# Patient Record
Sex: Male | Born: 1975 | Race: Black or African American | Hispanic: No | Marital: Married | Smoking: Current every day smoker
Health system: Southern US, Community
[De-identification: ages and names within clinical notes are randomized; demographics above are authoritative.]

## PROBLEM LIST (undated history)

## (undated) DIAGNOSIS — F419 Anxiety disorder, unspecified: Secondary | ICD-10-CM

---

## 2010-09-26 ENCOUNTER — Emergency Department: Payer: Self-pay | Admitting: Unknown Physician Specialty

## 2012-08-30 ENCOUNTER — Emergency Department: Payer: Self-pay | Admitting: Emergency Medicine

## 2014-05-16 ENCOUNTER — Encounter (HOSPITAL_COMMUNITY): Payer: Self-pay | Admitting: Emergency Medicine

## 2014-05-16 ENCOUNTER — Emergency Department (HOSPITAL_COMMUNITY)
Admission: EM | Admit: 2014-05-16 | Discharge: 2014-05-16 | Disposition: A | Discharge status: Home / Self Care | Attending: Emergency Medicine | Admitting: Emergency Medicine

## 2014-05-16 DIAGNOSIS — H5712 Ocular pain, left eye: Secondary | ICD-10-CM | POA: Diagnosis present

## 2014-05-16 DIAGNOSIS — H109 Unspecified conjunctivitis: Secondary | ICD-10-CM | POA: Diagnosis not present

## 2014-05-16 MED ORDER — TETRACAINE HCL 0.5 % OP SOLN
OPHTHALMIC | Status: AC
  Filled 2014-05-16: qty 2

## 2014-05-16 MED ORDER — GENTAMICIN SULFATE 0.3 % OP SOLN: 2.0000 [drp] | Freq: Four times a day (QID) | OPHTHALMIC | Status: DC

## 2014-05-16 MED ORDER — FLUORESCEIN SODIUM 1 MG OP STRP
ORAL_STRIP | OPHTHALMIC | Status: AC
  Filled 2014-05-16: qty 1

## 2014-05-16 NOTE — ED Provider Notes (Addendum)
CSN: [id number]     Arrival date & time 05/16/14  [id number] History   First MD Initiated Contact with Patient 05/16/14 0539     Chief Complaint  Patient presents with  . Eye Pain     (Consider location/radiation/quality/duration/timing/severity/associated sxs/prior Treatment) HPI Comments: Patient is a 39 year old male with no significant past medical history. He complaints of left eye redness and discomfort for the past week. He is currently incarcerated and was given antibiotics by the medical staff at the jail. He was treated with Keflex however is not improving. He is brought here to be evaluated for this now.  Patient is a 39 y.o. male presenting with eye pain. The history is provided by the patient.  Eye Pain This is a new problem. Episode onset: 5 days ago. The problem occurs constantly. The problem has been gradually worsening. Nothing aggravates the symptoms. Nothing relieves the symptoms. He has tried nothing for the symptoms.    History reviewed. No pertinent past medical history. History reviewed. No pertinent past surgical history. History reviewed. No pertinent family history. History  Substance Use Topics  . Smoking status: Never Smoker   . Smokeless tobacco: Not on file  . Alcohol Use: No    Review of Systems  Eyes: Positive for pain.  All other systems reviewed and are negative.     Allergies  Review of patient's allergies indicates not on file.  Home Medications   Prior to Admission medications   Not on File   BP 135/104 mmHg  Pulse 64  Temp(Src) 97.6 F (36.4 C) (Oral)  Resp 20  Ht 6\' 1"  (1.854 m)  Wt 228 lb (103.42 kg)  BMI 30.09 kg/m2  SpO2 100% Physical Exam  Constitutional: He appears well-developed and well-nourished. No distress.  HENT:  Head: Normocephalic and atraumatic.  Eyes:  The left eye is noted to have conjunctiva that is injected with clear drainage. The cornea is clear to both gross inspection and fluorescein staining. Anterior  chamber is clear. Intraocular pressure was measured with the Tono-Pen and was found to be 4, 6, and 7.  Skin: He is not diaphoretic.  Nursing note and vitals reviewed.   ED Course  Procedures (including critical care time) Labs Review Labs Reviewed - No data to display  Imaging Review No results found.   EKG Interpretation None      MDM   Final diagnoses:  None    This appears to be a conjunctivitis. There is no concern for glaucoma or corneal involvement. The patient continues to tell me that the last time he had this he was given 2 drops. He tells me one was for infection and the other was for "pressure". His eye pressures were checked with the Tono-Pen and were found to be normal. I will treat with gentamicin drops and when necessary follow-up. Patient seems somewhat annoyed and I'm not giving him another drop, however I do not feel as though another is indicated.    Geoffery Lyonsouglas Montine Hight, MD 05/16/14 [id number]  Geoffery Lyonsouglas Marianita Botkin, MD 05/16/14 [phone]

## 2014-05-16 NOTE — Discharge Instructions (Signed)
Gentamicin drops as prescribed.  Ibuprofen 600 mg every 6 hours as needed for pain.  Return to the emergency department if your symptoms substantially worsen or change.   Conjunctivitis Conjunctivitis is commonly called "pink eye." Conjunctivitis can be caused by bacterial or viral infection, allergies, or injuries. There is usually redness of the lining of the eye, itching, discomfort, and sometimes discharge. There may be deposits of matter along the eyelids. A viral infection usually causes a watery discharge, while a bacterial infection causes a yellowish, thick discharge. Pink eye is very contagious and spreads by direct contact. You may be given antibiotic eyedrops as part of your treatment. Before using your eye medicine, remove all drainage from the eye by washing gently with warm water and cotton balls. Continue to use the medication until you have awakened 2 mornings in a row without discharge from the eye. Do not rub your eye. This increases the irritation and helps spread infection. Use separate towels from other household members. Wash your hands with soap and water before and after touching your eyes. Use cold compresses to reduce pain and sunglasses to relieve irritation from light. Do not wear contact lenses or wear eye makeup until the infection is gone. SEEK MEDICAL CARE IF:   Your symptoms are not better after 3 days of treatment.  You have increased pain or trouble seeing.  The outer eyelids become very red or swollen. Document Released: 03/31/2004 Document Revised: 05/16/2011 Document Reviewed: 02/21/2005 [facility] Patient Information 2015 [place], [place]. This information is not intended to replace advice given to you by your health care provider. Make sure you discuss any questions you have with your health care provider.

## 2014-05-16 NOTE — ED Notes (Signed)
Pt c/o left eye pain and pressure x one week. Pt denies any vision changes.

## 2014-05-27 ENCOUNTER — Encounter (HOSPITAL_COMMUNITY): Payer: Self-pay | Admitting: Emergency Medicine

## 2014-05-27 ENCOUNTER — Emergency Department (HOSPITAL_COMMUNITY)
Admission: EM | Admit: 2014-05-27 | Discharge: 2014-05-27 | Disposition: A | Discharge status: Home / Self Care | Attending: Emergency Medicine | Admitting: Emergency Medicine

## 2014-05-27 DIAGNOSIS — Z792 Long term (current) use of antibiotics: Secondary | ICD-10-CM | POA: Diagnosis not present

## 2014-05-27 DIAGNOSIS — H5712 Ocular pain, left eye: Secondary | ICD-10-CM | POA: Diagnosis present

## 2014-05-27 MED ORDER — TOBRAMYCIN 0.3 % OP SOLN: 2.0000 [drp] | OPHTHALMIC | Status: DC

## 2014-05-27 MED ORDER — FLUORESCEIN SODIUM 1 MG OP STRP
ORAL_STRIP | OPHTHALMIC | Status: AC
  Administered 2014-05-27: 07:00:00
  Filled 2014-05-27: qty 1

## 2014-05-27 MED ORDER — TETRACAINE HCL 0.5 % OP SOLN
2.0000 [drp] | Freq: Once | OPHTHALMIC | Status: AC
  Administered 2014-05-27: 2 [drp] via OPHTHALMIC
  Filled 2014-05-27: qty 2

## 2014-05-27 NOTE — ED Notes (Signed)
Pt c/o left eye pain since 05/16/14, pt states he was seen for the same.

## 2014-05-27 NOTE — ED Provider Notes (Signed)
CSN: [id number]     Arrival date & time 05/27/14  0357 History   First MD Initiated Contact with Patient 05/27/14 [phone]     Chief Complaint  Patient presents with  . Eye Pain     (Consider location/radiation/quality/duration/timing/severity/associated sxs/prior Treatment) HPI  Patient reports he was having problems with his left eye was seen in the ER on March 11 and was given antibiotic drops which had helped his pain. He states his eye was no longer read in 2 days. He was seen by the prison physician and told him his eyes were dry and he stopped the antibiotic drops and put him on saline eyedrops. Relates March 20 he started getting watering drainage from his eyes and feels like his vision is blurred in the left eye. He states his left eye hurts when he first opens it and he sees the light. He denies any itching. He denies any purulent drainage. She had some other eye problem in 2013 with his eye and saw her ophthalmologist. He does not know what that diagnosis was.    History reviewed. No pertinent past medical history. History reviewed. No pertinent past surgical history. History reviewed. No pertinent family history. History  Substance Use Topics  . Smoking status: Never Smoker   . Smokeless tobacco: Not on file  . Alcohol Use: No  pt currently incarcerated  Review of Systems  All other systems reviewed and are negative.     Allergies  Review of patient's allergies indicates not on file.  Home Medications   Prior to Admission medications   Medication Sig Start Date End Date Taking? Authorizing Provider  gentamicin (GARAMYCIN) 0.3 % ophthalmic solution Place 2 drops into both eyes 4 (four) times daily. 05/16/14   Geoffery Lyonsouglas Delo, MD  tobramycin (TOBREX) 0.3 % ophthalmic solution Place 2 drops into the left eye every 2 (two) hours while awake. 05/27/14   Devoria AlbeIva Althea Backs, MD   BP 144/97 mmHg  Pulse 66  Temp(Src) 97.9 F (36.6 C)  Resp 17  Ht 6\' 1"  (1.854 m)  Wt 228 lb (103.42 kg)   BMI 30.09 kg/m2  SpO2 100%  Vital signs normal   Physical Exam  Constitutional: He is oriented to person, place, and time. He appears well-developed and well-nourished.  Non-toxic appearance. He does not appear ill. No distress.  HENT:  Head: Normocephalic and atraumatic.  Right Ear: External ear normal.  Left Ear: External ear normal.  Nose: Nose normal. No mucosal edema or rhinorrhea.  Mouth/Throat: Oropharynx is clear and moist and mucous membranes are normal. No dental abscesses or uvula swelling.  Eyes: Conjunctivae and EOM are normal. Pupils are equal, round, and reactive to light.  Visual Acuity - Bilateral Distance: 20/25 ; R Distance: 20/25 ; L Distance: 20/30 Patient has bilateral miotic pupils. He has mild diffuse injection of both eyes worse on the left.  Neck: Normal range of motion and full passive range of motion without pain. Neck supple.  Pulmonary/Chest: Effort normal. No respiratory distress. He has no rhonchi. He exhibits no crepitus.  Abdominal: Soft. Normal appearance and bowel sounds are normal.  Musculoskeletal: Normal range of motion. He exhibits no edema or tenderness.  Moves all extremities well.   Neurological: He is alert and oriented to person, place, and time. He has normal strength. No cranial nerve deficit.  Skin: Skin is warm, dry and intact. No rash noted. No erythema. No pallor.  Psychiatric: He has a normal mood and affect. His speech is normal and  behavior is normal. His mood appears not anxious.  Nursing note and vitals reviewed.      ED Course  Procedures (including critical care time)  Medications  tetracaine (PONTOCAINE) 0.5 % ophthalmic solution 2 drop (not administered)  fluorescein 1 MG ophthalmic strip (not administered)   Fluorescein stain uptake was negative in the left eye.  Intraocular pressure was measured at 20 which is normal in the left eye.   Labs Review Labs Reviewed - No data to display  Imaging Review No results  found.   EKG Interpretation None      MDM  patient is noted to have some mild light sensitivity and drainage from his left eye. He may have a conjunctivitis but he most likely is developing an iritis. He was started on antibiotic eyedrops. He was given the ophthalmology referral for further evaluation.    Final diagnoses:  Eye pain, left    New Prescriptions   TOBRAMYCIN (TOBREX) 0.3 % OPHTHALMIC SOLUTION    Place 2 drops into the left eye every 2 (two) hours while awake.    Plan discharge  Devoria Albe, MD, Concha Pyo, MD 05/27/14 [phone]

## 2014-05-27 NOTE — ED Notes (Signed)
pt states eye drops he was given he was able to use for 2 days & Dr at the facility stopped the drops. Pt says eye was getting better before drops were stopped.

## 2014-05-27 NOTE — Discharge Instructions (Signed)
Use the eye drops in the affected eye every 2 hrs while awake the first 24 hrs then 4 times a day. If your eye isn't improving consider seeing an ophthalmologist. You may be developing iritis and may need dilating eye drops if you develop more light sensitivity.

## 2019-02-13 ENCOUNTER — Other Ambulatory Visit: Payer: Self-pay

## 2019-02-13 ENCOUNTER — Emergency Department
Admission: EM | Admit: 2019-02-13 | Discharge: 2019-02-13 | Disposition: A | Discharge status: Home / Self Care | Attending: Emergency Medicine | Admitting: Emergency Medicine

## 2019-02-13 ENCOUNTER — Encounter: Payer: Self-pay | Admitting: Emergency Medicine

## 2019-02-13 DIAGNOSIS — H209 Unspecified iridocyclitis: Secondary | ICD-10-CM | POA: Insufficient documentation

## 2019-02-13 MED ORDER — TETRACAINE HCL 0.5 % OP SOLN
2.0000 [drp] | Freq: Once | OPHTHALMIC | Status: AC
  Administered 2019-02-13: 2 [drp] via OPHTHALMIC
  Filled 2019-02-13: qty 4

## 2019-02-13 MED ORDER — PREDNISOLONE ACETATE 1 % OP SUSP
1.0000 [drp] | OPHTHALMIC | Status: DC
  Administered 2019-02-13: 1 [drp] via OPHTHALMIC
  Filled 2019-02-13: qty 5

## 2019-02-13 MED ORDER — OXYCODONE-ACETAMINOPHEN 5-325 MG PO TABS
1.0000 | ORAL_TABLET | Freq: Once | ORAL | Status: AC
  Administered 2019-02-13: 1 via ORAL
  Filled 2019-02-13: qty 1

## 2019-02-13 MED ORDER — OXYCODONE-ACETAMINOPHEN 5-325 MG PO TABS: 1.0000 | ORAL_TABLET | ORAL | 0 refills | Status: DC | PRN

## 2019-02-13 NOTE — ED Notes (Signed)
ED Provider at bedside. 

## 2019-02-13 NOTE — ED Triage Notes (Signed)
Patient ambulatory to triage with steady gait, without difficulty or distress noted, mask in place; pt st "I have an infection in my left eye"; Hx chronic anterior uveitis

## 2019-02-13 NOTE — Discharge Instructions (Signed)
1.  You may take ibuprofen as needed for pain; Percocet as needed for more severe pain. 2.  Apply Prednisone eyedrop - 1 drop to left eye every 2 hours while awake. 3.  Return to the ER for worsening symptoms, vision changes, persistent vomiting or other concerns.

## 2019-02-13 NOTE — ED Provider Notes (Signed)
[facility] Emergency Department Provider Note   ____________________________________________   First MD Initiated Contact with Patient 02/13/19 [phone]     (approximate)  I have reviewed the triage vital signs and the nursing notes.   HISTORY  Chief Complaint Eye Problem    HPI Benjamin Combs is a 43 y.o. male who presents to the ED from home with a chief complaint of "I have an infection in my left eye".  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  He had a negative work-up for sarcoidosis in 2017.  That was his last occurrence and he was lost to ophthalmology follow-up.  Presents with a 2-day history of left eye pain, feeling like the eyeball is swollen with light sensitivity.  States this feels exactly like his other flareups.  Denies fever, cough, chest pain, shortness of breath, abdominal pain, nausea or vomiting.  Does not wear corrective lenses.       Past medical history Chronic left anterior uveitis  There are no active problems to display for this patient.   History reviewed. No pertinent surgical history.  Prior to Admission medications   Medication Sig Start Date End Date Taking? Authorizing Provider  gentamicin (GARAMYCIN) 0.3 % ophthalmic solution Place 2 drops into both eyes 4 (four) times daily. 05/16/14   Geoffery Lyons, MD  oxyCODONE-acetaminophen (PERCOCET/ROXICET) 5-325 MG tablet Take 1 tablet by mouth every 4 (four) hours as needed for severe pain. 02/13/19   Irean Hong, MD  tobramycin (TOBREX) 0.3 % ophthalmic solution Place 2 drops into the left eye every 2 (two) hours while awake. 05/27/14   Devoria Albe, MD    Allergies Patient has no known allergies.  No family history on file.  Social History Social History   Tobacco Use  . Smoking status: Never Smoker  . Smokeless tobacco: Never Used  Substance Use Topics  . Alcohol use: No  . Drug use: No    Review of  Systems  Constitutional: No fever/chills Eyes: Positive for left eye pain.  No visual changes. ENT: No sore throat. Cardiovascular: Denies chest pain. Respiratory: Denies shortness of breath. Gastrointestinal: No abdominal pain.  No nausea, no vomiting.  No diarrhea.  No constipation. Genitourinary: Negative for dysuria. Musculoskeletal: Negative for back pain. Skin: Negative for rash. Neurological: Negative for headaches, focal weakness or numbness.   ____________________________________________   PHYSICAL EXAM:  VITAL SIGNS: ED Triage Vitals  Enc Vitals Group     BP 02/13/19 0245 117/72     Pulse Rate 02/13/19 0245 98     Resp 02/13/19 0245 18     Temp 02/13/19 0245 98 F (36.7 C)     Temp Source 02/13/19 0245 Oral     SpO2 02/13/19 0245 97 %     Weight 02/13/19 0244 205 lb (93 kg)     Height 02/13/19 0244 6\' 1"  (1.854 m)     Head Circumference --      Peak Flow --      Pain Score 02/13/19 0244 10     Pain Loc --      Pain Edu? --      Excl. in GC? --     Constitutional: Alert and oriented. Well appearing and in no acute distress.  Wearing sunglasses. Eyes: VA noted.  Bilateral conjunctive injected, left greater than right. PERRL. EOMI. Sensitive to light.  Tetracaine applied and both eyes examined with Tono-Pen.  OD 10 OS 14.  Mild left-sided flare seen on portable slit-lamp exam.  No hyphema.  No hypopion. Head: Atraumatic. Nose: No congestion/rhinnorhea. Mouth/Throat: Mucous membranes are moist.  Oropharynx non-erythematous. Neck: No stridor.   Cardiovascular: Normal rate, regular rhythm. Grossly normal heart sounds.  Good peripheral circulation. Respiratory: Normal respiratory effort.  No retractions. Lungs CTAB. Gastrointestinal: Soft and nontender. No distention. No abdominal bruits. No CVA tenderness. Musculoskeletal: No lower extremity tenderness nor edema.  No joint effusions. Neurologic:  Normal speech and language. No gross focal neurologic deficits are  appreciated. No gait instability. Skin:  Skin is warm, dry and intact. No rash noted. Psychiatric: Mood and affect are normal. Speech and behavior are normal.  ____________________________________________   LABS (all labs ordered are listed, but only abnormal results are displayed)  Labs Reviewed - No data to display ____________________________________________  EKG  None ____________________________________________  RADIOLOGY  ED MD interpretation: None  Official radiology report(s): No results found.  ____________________________________________   PROCEDURES  Procedure(s) performed (including Critical Care):  Procedures   ____________________________________________   INITIAL IMPRESSION / ASSESSMENT AND PLAN / ED COURSE  As part of my medical decision making, I reviewed the following data within the [facility] notes reviewed and incorporated, Old chart reviewed, Notes from prior ED visits and [place] [employer] was evaluated in Emergency Department on 02/13/2019 for the symptoms described in the history of present illness. He was evaluated in the context of the global COVID-19 pandemic, which necessitated consideration that the patient might be at risk for infection with the SARS-CoV-2 virus that causes COVID-19. Institutional protocols and algorithms that pertain to the evaluation of patients at risk for COVID-19 are in a state of rapid change based on information released by regulatory bodies including the CDC and federal and state organizations. These policies and algorithms were followed during the patient's care in the ED.    43 year old male who presents with left eye pain similar to previous flareups of anterior uveitis.  Will administer Percocet for pain and discuss with ophthalmology.   Clinical Course as of Feb 12 402  Wed Feb 13, 2019  0359 Discussed with ophthalmology on-call Dr. Edison Pace who  does advise given patient's frequent reoccurrence to go ahead and start prednisone eyedrops and he will see patient in the office later today.  Strict return precautions given.  Patient and family verbalized understanding and agree with plan of care.   [JS]    Clinical Course User Index [JS] Paulette Blanch, MD     ____________________________________________   FINAL CLINICAL IMPRESSION(S) / ED DIAGNOSES  Final diagnoses:  Uveitis of left eye     ED Discharge Orders         Ordered    oxyCODONE-acetaminophen (PERCOCET/ROXICET) 5-325 MG tablet  Every 4 hours PRN     02/13/19 0401           Note:  This document was prepared using Dragon voice recognition software and may include unintentional dictation errors.   Paulette Blanch, MD 02/13/19 [phone]

## 2019-09-15 ENCOUNTER — Emergency Department
Admission: EM | Admit: 2019-09-15 | Discharge: 2019-09-15 | Disposition: A | Discharge status: Home / Self Care | Payer: Self-pay | Attending: Emergency Medicine | Admitting: Emergency Medicine

## 2019-09-15 ENCOUNTER — Other Ambulatory Visit: Payer: Self-pay

## 2019-09-15 ENCOUNTER — Encounter: Payer: Self-pay | Admitting: Emergency Medicine

## 2019-09-15 DIAGNOSIS — R531 Weakness: Secondary | ICD-10-CM | POA: Insufficient documentation

## 2019-09-15 DIAGNOSIS — Z5321 Procedure and treatment not carried out due to patient leaving prior to being seen by health care provider: Secondary | ICD-10-CM | POA: Insufficient documentation

## 2019-09-15 DIAGNOSIS — R450 Nervousness: Secondary | ICD-10-CM | POA: Insufficient documentation

## 2019-09-15 HISTORY — DX: Anxiety disorder, unspecified: F41.9

## 2019-09-15 LAB — CBC
HCT: 45 % (ref 39.0–52.0)
Hemoglobin: 15.6 g/dL (ref 13.0–17.0)
MCH: 27 pg (ref 26.0–34.0)
MCHC: 34.7 g/dL (ref 30.0–36.0)
MCV: 78 fL — ABNORMAL LOW (ref 80.0–100.0)
Platelets: 334 10*3/uL (ref 150–400)
RBC: 5.77 MIL/uL (ref 4.22–5.81)
RDW: 14.2 % (ref 11.5–15.5)
WBC: 12.2 10*3/uL — ABNORMAL HIGH (ref 4.0–10.5)
nRBC: 0 % (ref 0.0–0.2)

## 2019-09-15 LAB — BASIC METABOLIC PANEL
Anion gap: 7 (ref 5–15)
BUN: 11 mg/dL (ref 6–20)
CO2: 25 mmol/L (ref 22–32)
Calcium: 9.1 mg/dL (ref 8.9–10.3)
Chloride: 107 mmol/L (ref 98–111)
Creatinine, Ser: 1.37 mg/dL — ABNORMAL HIGH (ref 0.61–1.24)
GFR calc Af Amer: >60 mL/min (ref >60)
GFR calc non Af Amer: >60 mL/min (ref >60)
Glucose, Bld: 126 mg/dL — ABNORMAL HIGH (ref 70–99)
Potassium: 3.5 mmol/L (ref 3.5–5.1)
Sodium: 139 mmol/L (ref 135–145)

## 2019-09-15 MED ORDER — SODIUM CHLORIDE 0.9% FLUSH: 3.0000 mL | Freq: Once | INTRAVENOUS | Status: DC

## 2019-09-15 NOTE — ED Notes (Signed)
Pt called for repeat x 3, no answer.

## 2019-09-15 NOTE — ED Notes (Signed)
Pt called for repeat VS x 2, not visualized in ED or outside.

## 2019-09-15 NOTE — ED Triage Notes (Signed)
Pt to ED via POV c/o weakness and feeling jittery. Pt states that he felt that way when he woke up around 0830 and it has continued throughout the day. Pt denies any past medical history. Pt states that he has been under more stress recently. Pt states that he does have anxiety. Pt reports that he does feel anxious. Pt is in NAD at this time.

## 2019-09-15 NOTE — ED Notes (Signed)
Called for repeat VS x 1.  

## 2019-09-16 ENCOUNTER — Emergency Department: Admission: EM | Admit: 2019-09-16 | Discharge: 2019-09-16 | Discharge status: Against Medical Advice | Payer: Self-pay

## 2019-09-16 ENCOUNTER — Telehealth: Payer: Self-pay | Admitting: Emergency Medicine

## 2019-09-16 NOTE — Telephone Encounter (Signed)
Called patient due to lwot to inquire about condition and follow up plans.  No answer and no voicemail  

## 2019-09-16 NOTE — ED Triage Notes (Signed)
Upon entering triage room, pt states, "I am refusing medical treatment."  Pt A/Ox4, and in police custody.  Ambulatory without difficulty.

## 2019-10-04 ENCOUNTER — Encounter: Payer: Self-pay | Admitting: Emergency Medicine

## 2019-10-04 ENCOUNTER — Emergency Department
Admission: EM | Admit: 2019-10-04 | Discharge: 2019-10-04 | Disposition: A | Discharge status: Home / Self Care | Payer: Self-pay | Attending: Emergency Medicine | Admitting: Emergency Medicine

## 2019-10-04 ENCOUNTER — Other Ambulatory Visit: Payer: Self-pay

## 2019-10-04 DIAGNOSIS — R1030 Lower abdominal pain, unspecified: Secondary | ICD-10-CM | POA: Insufficient documentation

## 2019-10-04 DIAGNOSIS — Z5321 Procedure and treatment not carried out due to patient leaving prior to being seen by health care provider: Secondary | ICD-10-CM | POA: Insufficient documentation

## 2019-10-04 DIAGNOSIS — R197 Diarrhea, unspecified: Secondary | ICD-10-CM | POA: Insufficient documentation

## 2019-10-04 LAB — COMPREHENSIVE METABOLIC PANEL
ALT: 13 U/L (ref 0–44)
AST: 17 U/L (ref 15–41)
Albumin: 4.4 g/dL (ref 3.5–5.0)
Alkaline Phosphatase: 72 U/L (ref 38–126)
Anion gap: 9 (ref 5–15)
BUN: 11 mg/dL (ref 6–20)
CO2: 24 mmol/L (ref 22–32)
Calcium: 9.1 mg/dL (ref 8.9–10.3)
Chloride: 107 mmol/L (ref 98–111)
Creatinine, Ser: 1.19 mg/dL (ref 0.61–1.24)
GFR calc Af Amer: >60 mL/min (ref >60)
GFR calc non Af Amer: >60 mL/min (ref >60)
Glucose, Bld: 98 mg/dL (ref 70–99)
Potassium: 3.4 mmol/L — ABNORMAL LOW (ref 3.5–5.1)
Sodium: 140 mmol/L (ref 135–145)
Total Bilirubin: 0.8 mg/dL (ref 0.3–1.2)
Total Protein: 7.3 g/dL (ref 6.5–8.1)

## 2019-10-04 LAB — CBC
HCT: 40.5 % (ref 39.0–52.0)
Hemoglobin: 14.1 g/dL (ref 13.0–17.0)
MCH: 27.2 pg (ref 26.0–34.0)
MCHC: 34.8 g/dL (ref 30.0–36.0)
MCV: 78.2 fL — ABNORMAL LOW (ref 80.0–100.0)
Platelets: 282 10*3/uL (ref 150–400)
RBC: 5.18 MIL/uL (ref 4.22–5.81)
RDW: 14.2 % (ref 11.5–15.5)
WBC: 9.7 10*3/uL (ref 4.0–10.5)
nRBC: 0 % (ref 0.0–0.2)

## 2019-10-04 LAB — LIPASE, BLOOD: Lipase: 28 U/L (ref 11–51)

## 2019-10-04 NOTE — ED Notes (Signed)
Cannot find pt. 

## 2019-10-04 NOTE — ED Notes (Signed)
No answer for repeat vitals °

## 2019-10-04 NOTE — ED Triage Notes (Signed)
here for lower mid abdominal pain X 2 days with mild diarrhea.  NAD.  No fever or vomiting.  Denies urinary sx.

## 2019-10-04 NOTE — ED Notes (Signed)
No answer for repeat assessment.

## 2020-12-13 ENCOUNTER — Emergency Department
Admission: EM | Admit: 2020-12-13 | Discharge: 2020-12-13 | Disposition: A | Discharge status: Home / Self Care | Payer: Self-pay | Attending: Emergency Medicine | Admitting: Emergency Medicine

## 2020-12-13 ENCOUNTER — Other Ambulatory Visit: Payer: Self-pay

## 2020-12-13 ENCOUNTER — Emergency Department: Payer: Self-pay

## 2020-12-13 DIAGNOSIS — F17200 Nicotine dependence, unspecified, uncomplicated: Secondary | ICD-10-CM | POA: Insufficient documentation

## 2020-12-13 DIAGNOSIS — M5412 Radiculopathy, cervical region: Secondary | ICD-10-CM | POA: Insufficient documentation

## 2020-12-13 MED ORDER — METHOCARBAMOL 500 MG PO TABS: 500.0000 mg | ORAL_TABLET | Freq: Three times a day (TID) | ORAL | 0 refills | Status: AC | PRN

## 2020-12-13 MED ORDER — PREDNISONE 10 MG (21) PO TBPK: ORAL_TABLET | ORAL | 0 refills | Status: DC

## 2020-12-13 MED ORDER — METHOCARBAMOL 500 MG PO TABS
1000.0000 mg | ORAL_TABLET | Freq: Once | ORAL | Status: AC
  Administered 2020-12-13: 1000 mg via ORAL
  Filled 2020-12-13: qty 2

## 2020-12-13 NOTE — ED Provider Notes (Signed)
[facility]-EMERGENCY DEPARTMENT  ____________________________________________  Time seen: Approximately 4:13 PM  I have reviewed the triage vital signs and the nursing notes.   HISTORY  Chief Complaint Neck Pain   Historian Patient     Benjamin Combs is a 45 y.o. male presents to the emergency department with neck pain that radiates to the right upper extremity into the hand.  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  He denies weakness in the right upper extremity or neck issues in the past.  No chest pain, chest tightness or shortness of breath.  Denies falls.  No other alleviating measures have been attempted.   Past Medical History:  Diagnosis Date   Anxiety      Immunizations up to date:  Yes.     Past Medical History:  Diagnosis Date   Anxiety     There are no problems to display for this patient.   History reviewed. No pertinent surgical history.  Prior to Admission medications   Medication Sig Start Date End Date Taking? Authorizing Provider  methocarbamol (ROBAXIN) 500 MG tablet Take 1 tablet (500 mg total) by mouth every 8 (eight) hours as needed for up to 5 days. 12/13/20 12/18/20 Yes Pia Mau M, PA-C  predniSONE (STERAPRED UNI-PAK 21 TAB) 10 MG (21) TBPK tablet Take 6 tablets the first day, take 5 tablets the second day, take 4 tablets the third day, take 3 tablets the fourth day, take 2 tablets the fifth day, take 1 tablet the sixth day. 12/13/20  Yes Pia Mau M, PA-C  gentamicin (GARAMYCIN) 0.3 % ophthalmic solution Place 2 drops into both eyes 4 (four) times daily. 05/16/14   Geoffery Lyons, MD  oxyCODONE-acetaminophen (PERCOCET/ROXICET) 5-325 MG tablet Take 1 tablet by mouth every 4 (four) hours as needed for severe pain. 02/13/19   Irean Hong, MD  tobramycin (TOBREX) 0.3 % ophthalmic solution Place 2 drops into the left eye every 2 (two) hours while awake. 05/27/14    Devoria Albe, MD    Allergies Patient has no known allergies.  No family history on file.  Social History Social History   Tobacco Use   Smoking status: Every Day   Smokeless tobacco: Never  Substance Use Topics   Alcohol use: No   Drug use: Yes    Types: Marijuana     Review of Systems  Constitutional: No fever/chills Eyes:  No discharge ENT: No upper respiratory complaints. Respiratory: no cough. No SOB/ use of accessory muscles to breath Gastrointestinal:   No nausea, no vomiting.  No diarrhea.  No constipation. Musculoskeletal: Patient has neck pain.   Skin: Negative for rash, abrasions, lacerations, ecchymosis.    ____________________________________________   PHYSICAL EXAM:  VITAL SIGNS: ED Triage Vitals [12/13/20 1504]  Enc Vitals Group     BP (!) 131/99     Pulse Rate 86     Resp 19     Temp 97.9 F (36.6 C)     Temp src      SpO2 99 %     Weight      Height      Head Circumference      Peak Flow      Pain Score      Pain Loc      Pain Edu?      Excl. in GC?      Constitutional: Alert and oriented. Well appearing and in  no acute distress. Eyes: Conjunctivae are normal. PERRL. EOMI. Head: Atraumatic. ENT:      Nose: No congestion/rhinnorhea.      Mouth/Throat: Mucous membranes are moist.  Neck: No stridor.  Patient has paraspinal muscle tenderness to palpation on the right. Cardiovascular: Normal rate, regular rhythm. Normal S1 and S2.  Good peripheral circulation. Respiratory: Normal respiratory effort without tachypnea or retractions. Lungs CTAB. Good air entry to the bases with no decreased or absent breath sounds Gastrointestinal: Bowel sounds x 4 quadrants. Soft and nontender to palpation. No guarding or rigidity. No distention. Musculoskeletal: Full range of motion to all extremities. No obvious deformities noted Neurologic:  Normal for age. No gross focal neurologic deficits are appreciated.  Skin:  Skin is warm, dry and intact. No  rash noted. Psychiatric: Mood and affect are normal for age. Speech and behavior are normal.   ____________________________________________   LABS (all labs ordered are listed, but only abnormal results are displayed)  Labs Reviewed - No data to display ____________________________________________  EKG   ____________________________________________  RADIOLOGY Geraldo Pitter, personally viewed and evaluated these images (plain radiographs) as part of my medical decision making, as well as reviewing the written report by the radiologist.    CT Cervical Spine Wo Contrast  Result Date: 12/13/2020 CLINICAL DATA:  Neck pain radiating to right arm EXAM: CT CERVICAL SPINE WITHOUT CONTRAST TECHNIQUE: Multidetector CT imaging of the cervical spine was performed without intravenous contrast. Multiplanar CT image reconstructions were also generated. COMPARISON:  None. FINDINGS: Alignment: There is reversal of cervical lordosis, likely due to multilevel spondylosis in the mid to lower cervical spine. Otherwise alignment is anatomic. Skull base and vertebrae: No acute fracture. No primary bone lesion or focal pathologic process. Soft tissues and spinal canal: No prevertebral fluid or swelling. No visible canal hematoma. Disc levels: Prominent mid to lower cervical spondylosis, most pronounced at the C4-5, C5-6, and C6-7 levels. There is significant right predominant neural foraminal encroachment at C6-7 due to uncovertebral hypertrophy and disc osteophyte complex. Upper chest: Airway is patent.  Lung apices are clear. Other: Reconstructed images demonstrate no additional findings. IMPRESSION: 1. No acute cervical spine fracture. 2. Prominent lower cervical spondylosis, most pronounced at C6-7 with right predominant neural foraminal encroachment. Please correlate with patient's right upper extremity radicular symptoms. Electronically Signed   By: Sharlet Salina M.D.   On: 12/13/2020 16:40     ____________________________________________    PROCEDURES  Procedure(s) performed:     Procedures     Medications  methocarbamol (ROBAXIN) tablet 1,000 mg (1,000 mg Oral Given 12/13/20 1620)     ____________________________________________   INITIAL IMPRESSION / ASSESSMENT AND PLAN / ED COURSE  Pertinent labs & imaging results that were available during my care of the patient were reviewed by me and considered in my medical decision making (see chart for details).      Assessment and Plan: Neck pain:  45 year old male presents to the emergency department with neck pain that started acutely after patient was moving a piece of lawn equipment.  Vital signs are reassuring at triage.  On physical exam, patient was alert, active and nontoxic-appearing with no neurodeficits noted.  CT of the cervical spine indicated no acute fractures but did indicate right foraminal encroachment that would increase suspicion for cervical radiculopathy.  We will start patient on tapered prednisone and Robaxin.  Return precautions were given to return with new or worsening symptoms.  All patient questions were answered.    ____________________________________________  FINAL CLINICAL IMPRESSION(S) / ED DIAGNOSES  Final diagnoses:  Cervical radiculopathy      NEW MEDICATIONS STARTED DURING THIS VISIT:  ED Discharge Orders          Ordered    predniSONE (STERAPRED UNI-PAK 21 TAB) 10 MG (21) TBPK tablet        12/13/20 1720    methocarbamol (ROBAXIN) 500 MG tablet  Every 8 hours PRN        12/13/20 1720                This chart was dictated using voice recognition software/Dragon. Despite best efforts to proofread, errors can occur which can change the meaning. Any change was purely unintentional.     Orvil Feil, PA-C 12/13/20 2154    Gilles Chiquito, MD 12/14/20 [phone]

## 2020-12-13 NOTE — Discharge Instructions (Addendum)
Take tapered steroid as directed. Take Robaxin at night before bed.  

## 2020-12-13 NOTE — ED Triage Notes (Signed)
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

## 2021-02-02 ENCOUNTER — Other Ambulatory Visit: Payer: Self-pay

## 2021-02-02 ENCOUNTER — Emergency Department
Admission: EM | Admit: 2021-02-02 | Discharge: 2021-02-02 | Disposition: A | Discharge status: Home / Self Care | Payer: Self-pay | Attending: Emergency Medicine | Admitting: Emergency Medicine

## 2021-02-02 DIAGNOSIS — J101 Influenza due to other identified influenza virus with other respiratory manifestations: Secondary | ICD-10-CM | POA: Insufficient documentation

## 2021-02-02 DIAGNOSIS — Z20822 Contact with and (suspected) exposure to covid-19: Secondary | ICD-10-CM | POA: Insufficient documentation

## 2021-02-02 DIAGNOSIS — F17200 Nicotine dependence, unspecified, uncomplicated: Secondary | ICD-10-CM | POA: Insufficient documentation

## 2021-02-02 LAB — RESP PANEL BY RT-PCR (FLU A&B, COVID) ARPGX2
Influenza A by PCR: POSITIVE — AB
Influenza B by PCR: NEGATIVE
SARS Coronavirus 2 by RT PCR: NEGATIVE

## 2021-02-02 NOTE — ED Provider Notes (Signed)
[facility] Emergency Department Provider Note ____________________________________________   Event Date/Time   First MD Initiated Contact with Patient 02/02/21 1346     (approximate)  I have reviewed the triage vital signs and the nursing notes.  HISTORY  Chief Complaint flu/covid sx   HPI Benjamin Combs is a 45 y.o. malewho presents to the ED for evaluation of flulike symptoms.  Chart review indicates no relevant history.  Patient presents to the ED, accompanied by his "old lady," for evaluation of 5 days of diffuse myalgias, intermittent fevers, nonproductive cough and malaise.  Reports poor appetite without abdominal pain or emesis.  Does report intermittent watery diarrhea.  Denies chest pain, syncope or productive cough.  Reports positive sick contacts with influenza.  Reports diffuse myalgias without fall, trauma or injuries.   Past Medical History:  Diagnosis Date   Anxiety     There are no problems to display for this patient.   History reviewed. No pertinent surgical history.  Prior to Admission medications   Medication Sig Start Date End Date Taking? Authorizing Provider  gentamicin (GARAMYCIN) 0.3 % ophthalmic solution Place 2 drops into both eyes 4 (four) times daily. 05/16/14   Geoffery Lyons, MD  oxyCODONE-acetaminophen (PERCOCET/ROXICET) 5-325 MG tablet Take 1 tablet by mouth every 4 (four) hours as needed for severe pain. 02/13/19   Irean Hong, MD  predniSONE (STERAPRED UNI-PAK 21 TAB) 10 MG (21) TBPK tablet Take 6 tablets the first day, take 5 tablets the second day, take 4 tablets the third day, take 3 tablets the fourth day, take 2 tablets the fifth day, take 1 tablet the sixth day. 12/13/20   Orvil Feil, PA-C  tobramycin (TOBREX) 0.3 % ophthalmic solution Place 2 drops into the left eye every 2 (two) hours while awake. 05/27/14   Devoria Albe, MD    Allergies Patient has no known allergies.  No family history on  file.  Social History Social History   Tobacco Use   Smoking status: Every Day   Smokeless tobacco: Never  Substance Use Topics   Alcohol use: No   Drug use: Yes    Types: Marijuana    Review of Systems  Constitutional: Positive for subjective fever/chills Eyes: No visual changes. ENT: No sore throat. Cardiovascular: Denies chest pain. Respiratory: Denies shortness of breath.  Positive for nonproductive cough Gastrointestinal: No abdominal pain.   No constipation. Positive for watery diarrhea Genitourinary: Negative for dysuria. Musculoskeletal: Negative for back pain. Positive for diffuse atraumatic myalgias. Skin: Negative for rash. Neurological: Negative for headaches, focal weakness or numbness.  ____________________________________________   PHYSICAL EXAM:  VITAL SIGNS: Vitals:   02/02/21 1345 02/02/21 1400  BP: 132/86 126/79  Pulse: 94 97  Resp: 17   Temp:    SpO2: 98% 98%     Constitutional: Alert and oriented. Well appearing and in no acute distress.  Occasional nonproductive coughing, otherwise conversational. Eyes: Conjunctivae are normal. PERRL. EOMI. Head: Atraumatic. Nose: Clear congestion/rhinnorhea. Mouth/Throat: Mucous membranes are moist.  Oropharynx non-erythematous. Neck: No stridor. No cervical spine tenderness to palpation. Cardiovascular: Normal rate, regular rhythm. Grossly normal heart sounds.  Good peripheral circulation. Respiratory: Normal respiratory effort.  No retractions. Lungs CTAB. Gastrointestinal: Soft , nondistended, nontender to palpation. No CVA tenderness. Musculoskeletal: No lower extremity tenderness nor edema.  No joint effusions. No signs of acute trauma. Neurologic:  Normal speech and language. No gross focal neurologic deficits are appreciated. No gait instability noted. Skin:  Skin is warm, dry  and intact. No rash noted. Psychiatric: Mood and affect are normal. Speech and behavior are  normal.  ____________________________________________   LABS (all labs ordered are listed, but only abnormal results are displayed)  Labs Reviewed  RESP PANEL BY RT-PCR (FLU A&B, COVID) ARPGX2 - Abnormal; Notable for the following components:      Result Value   Influenza A by PCR POSITIVE (*)    All other components within normal limits   ____________________________________________  12 Lead EKG   ____________________________________________  RADIOLOGY  ED MD interpretation:    Official radiology report(s): No results found.  ____________________________________________   PROCEDURES and INTERVENTIONS  Procedure(s) performed (including Critical Care):  Procedures  Medications - No data to display  ____________________________________________   MDM / ED COURSE   Healthy 45 year old male presents 5 days into a viral syndrome, tested positive for influenza A, and amenable to outpatient management.  Normal vitals on room air.  He looks clinically well.  No productive cough or rhonchorous breath sounds to suggest CAP.  No barriers to outpatient management.  We will discharge with return precautions.     ____________________________________________   FINAL CLINICAL IMPRESSION(S) / ED DIAGNOSES  Final diagnoses:  Influenza A     ED Discharge Orders     None        Shardae Kleinman   Note:  This document was prepared using Dragon voice recognition software and may include unintentional dictation errors.    Delton Prairie, MD 02/02/21 [phone]

## 2021-02-02 NOTE — ED Provider Notes (Signed)
  Emergency Medicine Provider Triage Evaluation Note  Benjamin Combs , a 45 y.o.male,  was evaluated in triage.  Pt complains of flulike symptoms.  Patient states that he has been experiencing 4 days of fever, sinus congestion, body aches, and shortness of breath.  Denies chest pain, abdominal pain, urinary symptoms, or nausea/vomiting.   Review of Systems  Positive: Fever, sinus congestion, body aches Negative: Denies fever, chest pain, vomiting  Physical Exam   Vitals:   02/02/21 1221  BP: 131/87  Pulse: 90  Resp: 18  Temp: 99.2 F (37.3 C)  SpO2: 97%   Gen:   Awake, no distress   Resp:  Normal effort  MSK:   Moves extremities without difficulty  Other:    Medical Decision Making  Given the patient's initial medical screening exam, the following diagnostic evaluation has been ordered. The patient will be placed in the appropriate treatment space, once one is available, to complete the evaluation and treatment. I have discussed the plan of care with the patient and I have advised the patient that an ED physician or mid-level practitioner will reevaluate their condition after the test results have been received, as the results may give them additional insight into the type of treatment they may need.    Diagnostics: Respiratory panel.  Treatments: none immediately   Varney Daily, [place] 02/02/21 1244    Delton Prairie, MD 02/02/21 [phone]

## 2021-02-02 NOTE — Discharge Instructions (Addendum)
Please take Tylenol and ibuprofen/Advil for your pain.  It is safe to take them together, or to alternate them every few hours.  Take up to 1000mg of Tylenol at a time, up to 4 times per day.  Do not take more than 4000 mg of Tylenol in 24 hours.  For ibuprofen, take 400-600 mg, 4-5 times per day. ° ° °

## 2021-02-02 NOTE — ED Triage Notes (Signed)
Reports flu/covid sx for the past few days. Cough, body aches.   Pt asking to go to cafeteria while in triage.  NAD noted

## 2021-02-03 ENCOUNTER — Ambulatory Visit: Payer: Self-pay

## 2021-02-20 ENCOUNTER — Emergency Department
Admission: EM | Admit: 2021-02-20 | Discharge: 2021-02-20 | Disposition: A | Discharge status: Home / Self Care | Payer: Medicaid Other | Attending: Emergency Medicine | Admitting: Emergency Medicine

## 2021-02-20 ENCOUNTER — Emergency Department: Payer: Medicaid Other

## 2021-02-20 ENCOUNTER — Other Ambulatory Visit: Payer: Self-pay

## 2021-02-20 ENCOUNTER — Encounter: Payer: Self-pay | Admitting: Emergency Medicine

## 2021-02-20 DIAGNOSIS — Z20822 Contact with and (suspected) exposure to covid-19: Secondary | ICD-10-CM | POA: Insufficient documentation

## 2021-02-20 DIAGNOSIS — R051 Acute cough: Secondary | ICD-10-CM | POA: Insufficient documentation

## 2021-02-20 DIAGNOSIS — F17200 Nicotine dependence, unspecified, uncomplicated: Secondary | ICD-10-CM | POA: Insufficient documentation

## 2021-02-20 DIAGNOSIS — R0602 Shortness of breath: Secondary | ICD-10-CM | POA: Insufficient documentation

## 2021-02-20 LAB — RESP PANEL BY RT-PCR (FLU A&B, COVID) ARPGX2
Influenza A by PCR: NEGATIVE
Influenza B by PCR: NEGATIVE
SARS Coronavirus 2 by RT PCR: NEGATIVE

## 2021-02-20 MED ORDER — BENZONATATE 100 MG PO CAPS: 100.0000 mg | ORAL_CAPSULE | Freq: Three times a day (TID) | ORAL | 0 refills | Status: AC | PRN

## 2021-02-20 MED ORDER — PREDNISONE 20 MG PO TABS
60.0000 mg | ORAL_TABLET | Freq: Once | ORAL | Status: AC
  Administered 2021-02-20: 60 mg via ORAL
  Filled 2021-02-20: qty 3

## 2021-02-20 MED ORDER — PREDNISONE 10 MG (21) PO TBPK: ORAL_TABLET | ORAL | 0 refills | Status: DC

## 2021-02-20 MED ORDER — BENZONATATE 100 MG PO CAPS
200.0000 mg | ORAL_CAPSULE | Freq: Once | ORAL | Status: AC
  Administered 2021-02-20: 200 mg via ORAL
  Filled 2021-02-20: qty 2

## 2021-02-20 NOTE — Discharge Instructions (Signed)
Take tapered steroid as directed.  You can take Tessalon Perles at night before bed.

## 2021-02-20 NOTE — ED Triage Notes (Signed)
Pt via POV from home. Pt c/o cough, body aches, and flu like sx for over a week. States he was seen here on 11/29 and was positive for Flu. States his sx aren't getting any better.

## 2021-02-20 NOTE — ED Triage Notes (Signed)
Pt called from [facility] to treatment room, no response 

## 2021-02-20 NOTE — ED Provider Notes (Signed)
[facility]-EMERGENCY DEPARTMENT  ____________________________________________  Time seen: Approximately 6:40 PM  I have reviewed the triage vital signs and the nursing notes.   HISTORY  Chief Complaint Cough   Historian Patient     HPI Benjamin Combs is a 45 y.o. male presents to the emergency department with persistent cough productive for green sputum production since 1130.  Patient states that he tested positive for influenza but reports that his symptoms have not improved.  He denies current chest pain, chest tightness but states that he has had some shortness of breath. No other alleviating measures have been attempted.    Past Medical History:  Diagnosis Date   Anxiety      Immunizations up to date:  Yes.     Past Medical History:  Diagnosis Date   Anxiety     There are no problems to display for this patient.   History reviewed. No pertinent surgical history.  Prior to Admission medications   Medication Sig Start Date End Date Taking? Authorizing Provider  benzonatate (TESSALON PERLES) 100 MG capsule Take 1 capsule (100 mg total) by mouth 3 (three) times daily as needed for up to 7 days for cough. 02/20/21 02/27/21 Yes Pia Mau M, PA-C  predniSONE (STERAPRED UNI-PAK 21 TAB) 10 MG (21) TBPK tablet 6,5,4,3,2,1 02/20/21  Yes Joseph Art, Lockie Pares M, PA-C  gentamicin (GARAMYCIN) 0.3 % ophthalmic solution Place 2 drops into both eyes 4 (four) times daily. 05/16/14   Geoffery Lyons, MD  oxyCODONE-acetaminophen (PERCOCET/ROXICET) 5-325 MG tablet Take 1 tablet by mouth every 4 (four) hours as needed for severe pain. 02/13/19   Irean Hong, MD  tobramycin (TOBREX) 0.3 % ophthalmic solution Place 2 drops into the left eye every 2 (two) hours while awake. 05/27/14   Devoria Albe, MD    Allergies Patient has no known allergies.  History reviewed. No pertinent family history.  Social History Social History   Tobacco Use   Smoking status: Every Day   Smokeless tobacco:  Never  Substance Use Topics   Alcohol use: No   Drug use: Yes    Types: Marijuana     Review of Systems  Constitutional: No fever/chills Eyes:  No discharge ENT: No upper respiratory complaints. Respiratory: Patient has cough.  Gastrointestinal:   No nausea, no vomiting.  No diarrhea.  No constipation. Musculoskeletal: Negative for musculoskeletal pain. Skin: Negative for rash, abrasions, lacerations, ecchymosis.    ____________________________________________   PHYSICAL EXAM:  VITAL SIGNS: ED Triage Vitals [02/20/21 1713]  Enc Vitals Group     BP 125/77     Pulse Rate 98     Resp 20     Temp 98.8 F (37.1 C)     Temp Source Oral     SpO2 98 %     Weight 180 lb (81.6 kg)     Height 6\' 1"  (1.854 m)     Head Circumference      Peak Flow      Pain Score      Pain Loc      Pain Edu?      Excl. in GC?      Constitutional: Alert and oriented. Patient is lying supine. Eyes: Conjunctivae are normal. PERRL. EOMI. Head: Atraumatic. ENT:      Ears: Tympanic membranes are mildly injected with mild effusion bilaterally.       Nose: No congestion/rhinnorhea.      Mouth/Throat: Mucous membranes are moist. Posterior pharynx is mildly erythematous.  Hematological/Lymphatic/Immunilogical: No cervical lymphadenopathy.  Cardiovascular: Normal rate, regular rhythm. Normal S1 and S2.  Good peripheral circulation. Respiratory: Normal respiratory effort without tachypnea or retractions. Lungs CTAB. Good air entry to the bases with no decreased or absent breath sounds. Gastrointestinal: Bowel sounds 4 quadrants. Soft and nontender to palpation. No guarding or rigidity. No palpable masses. No distention. No CVA tenderness. Musculoskeletal: Full range of motion to all extremities. No gross deformities appreciated. Neurologic:  Normal speech and language. No gross focal neurologic deficits are appreciated.  Skin:  Skin is warm, dry and intact. No rash noted. Psychiatric: Mood and  affect are normal. Speech and behavior are normal. Patient exhibits appropriate insight and judgement.   ____________________________________________   LABS (all labs ordered are listed, but only abnormal results are displayed)  Labs Reviewed  RESP PANEL BY RT-PCR (FLU A&B, COVID) ARPGX2   ____________________________________________  EKG   ____________________________________________  RADIOLOGY Geraldo Pitter, personally viewed and evaluated these images (plain radiographs) as part of my medical decision making, as well as reviewing the written report by the radiologist.    DG Chest 2 View  Result Date: 02/20/2021 CLINICAL DATA:  Cough.  Body aches. EXAM: CHEST - 2 VIEW COMPARISON:  None. FINDINGS: The heart size and mediastinal contours are within normal limits. Both lungs are clear. The visualized skeletal structures are unremarkable. IMPRESSION: No active cardiopulmonary disease. Electronically Signed   By: Gerome Sam III M.D.   On: 02/20/2021 19:14    ____________________________________________    PROCEDURES  Procedure(s) performed:     Procedures     Medications  predniSONE (DELTASONE) tablet 60 mg (60 mg Oral Given 02/20/21 1955)  benzonatate (TESSALON) capsule 200 mg (200 mg Oral Given 02/20/21 1957)     ____________________________________________   INITIAL IMPRESSION / ASSESSMENT AND PLAN / ED COURSE  Pertinent labs & imaging results that were available during my care of the patient were reviewed by me and considered in my medical decision making (see chart for details).      Assessment and plan:  Cough 45 year old male presents to the emergency department with cough since November 30.  Chest x-ray shows no consolidations, opacities or infiltrates.  Will treat with Tessalon Perles and tapered prednisone.    ____________________________________________  FINAL CLINICAL IMPRESSION(S) / ED DIAGNOSES  Final diagnoses:  Acute cough       NEW MEDICATIONS STARTED DURING THIS VISIT:  ED Discharge Orders          Ordered    predniSONE (STERAPRED UNI-PAK 21 TAB) 10 MG (21) TBPK tablet        02/20/21 1938    benzonatate (TESSALON PERLES) 100 MG capsule  3 times daily PRN        02/20/21 1938                This chart was dictated using voice recognition software/Dragon. Despite best efforts to proofread, errors can occur which can change the meaning. Any change was purely unintentional.     Gasper Lloyd 02/20/21 2112    Arnaldo Natal, MD 02/21/21 [phone]

## 2021-06-01 ENCOUNTER — Emergency Department
Admission: EM | Admit: 2021-06-01 | Discharge: 2021-06-01 | Disposition: A | Discharge status: Home / Self Care | Payer: Medicaid Other | Attending: Emergency Medicine | Admitting: Emergency Medicine

## 2021-06-01 ENCOUNTER — Other Ambulatory Visit: Payer: Self-pay

## 2021-06-01 ENCOUNTER — Emergency Department: Payer: Medicaid Other

## 2021-06-01 ENCOUNTER — Encounter: Payer: Self-pay | Admitting: Emergency Medicine

## 2021-06-01 DIAGNOSIS — Z20822 Contact with and (suspected) exposure to covid-19: Secondary | ICD-10-CM | POA: Insufficient documentation

## 2021-06-01 DIAGNOSIS — J069 Acute upper respiratory infection, unspecified: Secondary | ICD-10-CM | POA: Insufficient documentation

## 2021-06-01 DIAGNOSIS — F17210 Nicotine dependence, cigarettes, uncomplicated: Secondary | ICD-10-CM | POA: Insufficient documentation

## 2021-06-01 LAB — RESP PANEL BY RT-PCR (FLU A&B, COVID) ARPGX2
Influenza A by PCR: NEGATIVE
Influenza B by PCR: NEGATIVE
SARS Coronavirus 2 by RT PCR: NEGATIVE

## 2021-06-01 MED ORDER — IPRATROPIUM-ALBUTEROL 0.5-2.5 (3) MG/3ML IN SOLN
3.0000 mL | Freq: Once | RESPIRATORY_TRACT | Status: AC
Start: 2021-06-01 — End: 2021-06-01
  Administered 2021-06-01: 3 mL via RESPIRATORY_TRACT
  Filled 2021-06-01: qty 3

## 2021-06-01 MED ORDER — PREDNISONE 50 MG PO TABS: 50.0000 mg | ORAL_TABLET | Freq: Every day | ORAL | 0 refills | Status: DC

## 2021-06-01 MED ORDER — IPRATROPIUM-ALBUTEROL 0.5-2.5 (3) MG/3ML IN SOLN
3.0000 mL | Freq: Once | RESPIRATORY_TRACT | Status: AC
  Administered 2021-06-01: 3 mL via RESPIRATORY_TRACT
  Filled 2021-06-01: qty 3

## 2021-06-01 NOTE — ED Triage Notes (Signed)
Pt to ED via POV with c/o Dizziness, SHOB, cough and congestion for about a week. He has been taking OTCs but they are not helping. When coughing nothing is coming up.  ?

## 2021-06-01 NOTE — ED Provider Notes (Signed)
? ?  [facility] ?Provider Note ? ? ? Event Date/Time  ? First MD Initiated Contact with Patient 06/01/21 [phone]   ?  (approximate) ? ? ?History  ? ?Cough, Nasal Congestion, Dizziness, and Shortness of Breath ? ? ?HPI ? ?Benjamin Combs is a 46 y.o. male who presents with 1 week of cough, nasal congestion, occasional shortness of breath and fatigue.  Denies body aches.  Reports that he feels like he has a chest cold and cannot clear it.  Does smoke cigarettes.  Has taken over-the-counter medications without improvement ?  ? ? ?Physical Exam  ? ?Triage Vital Signs: ?ED Triage Vitals  ?Enc Vitals Group  ?   BP 06/01/21 0858 (!) 133/92  ?   Pulse Rate 06/01/21 0858 86  ?   Resp 06/01/21 0858 20  ?   Temp 06/01/21 0858 98 ?F (36.7 ?C)  ?   Temp Source 06/01/21 0858 Oral  ?   SpO2 06/01/21 0858 97 %  ?   Weight 06/01/21 0859 86.2 kg (190 lb)  ?   Height 06/01/21 0859 1.854 m (6\' 1" )  ?   Head Circumference --   ?   Peak Flow --   ?   Pain Score 06/01/21 0859 8  ?   Pain Loc --   ?   Pain Edu? --   ?   Excl. in GC? --   ? ? ?Most recent vital signs: ?Vitals:  ? 06/01/21 0858  ?BP: (!) 133/92  ?Pulse: 86  ?Resp: 20  ?Temp: 98 ?F (36.7 ?C)  ?SpO2: 97%  ? ? ? ?General: Awake, no distress.  ?CV:  Good peripheral perfusion.  ?Resp:  Normal effort.  Scattered mild wheezes ?Abd:  No distention.  ?Other:   ? ? ?ED Results / Procedures / Treatments  ? ?Labs ?(all labs ordered are listed, but only abnormal results are displayed) ?Labs Reviewed  ?RESP PANEL BY RT-PCR (FLU A&B, COVID) [id number]  ? ? ? ?EKG ? ? ? ? ?RADIOLOGY ?Chest x-ray viewed by me, no acute abnormality ? ? ? ?PROCEDURES: ? ?Critical Care performed:  ? ?Procedures ? ? ?MEDICATIONS ORDERED IN ED: ?Medications  ?ipratropium-albuterol (DUONEB) 0.5-2.5 (3) MG/3ML nebulizer solution 3 mL (3 mLs Nebulization Given 06/01/21 0929)  ?ipratropium-albuterol (DUONEB) 0.5-2.5 (3) MG/3ML nebulizer solution 3 mL (3 mLs Nebulization Given 06/01/21 0929)   ? ? ? ?IMPRESSION / MDM / ASSESSMENT AND PLAN / ED COURSE  ?I reviewed the triage vital signs and the nursing notes. ? ? ? ?Patient presents with symptoms of upper respiratory infection.  Will obtain chest x-ray to evaluate for pneumonia. ? ?Given symptoms I suspect viral upper respiratory infection.  Will treat with duo nebs given smoking history and scattered wheezes ? ?Patient feeling improved after nebs, chest x-ray is negative for pneumonia, will discharge with prednisone, outpatient follow-up as needed ? ? ? ?  ? ? ?FINAL CLINICAL IMPRESSION(S) / ED DIAGNOSES  ? ?Final diagnoses:  ?Viral URI with cough  ? ? ? ?Rx / DC Orders  ? ?ED Discharge Orders   ? ?      Ordered  ?  predniSONE (DELTASONE) 50 MG tablet  Daily with breakfast       ? 06/01/21 0949  ? ?  ?  ? ?  ? ? ? ?Note:  This document was prepared using Dragon voice recognition software and may include unintentional dictation errors. ?  ?06/03/21, MD ?06/01/21 1037 ? ?

## 2023-03-28 IMAGING — CT CT CERVICAL SPINE W/O CM
3 of 4 series · 12 of 33 positions shown, 14 images · non-contrast
Comparison: None.

CLINICAL DATA: Neck pain radiating to right arm

EXAM:
CT CERVICAL SPINE WITHOUT CONTRAST
TECHNIQUE: Multidetector CT imaging of the cervical spine was performed without
intravenous contrast. Multiplanar CT image reconstructions were also
generated.

[Series 4: sagittal bone · sagittal · 0.26mm/px · 5 of 70 slices shown, 6 images]
[im 24/70  bone]
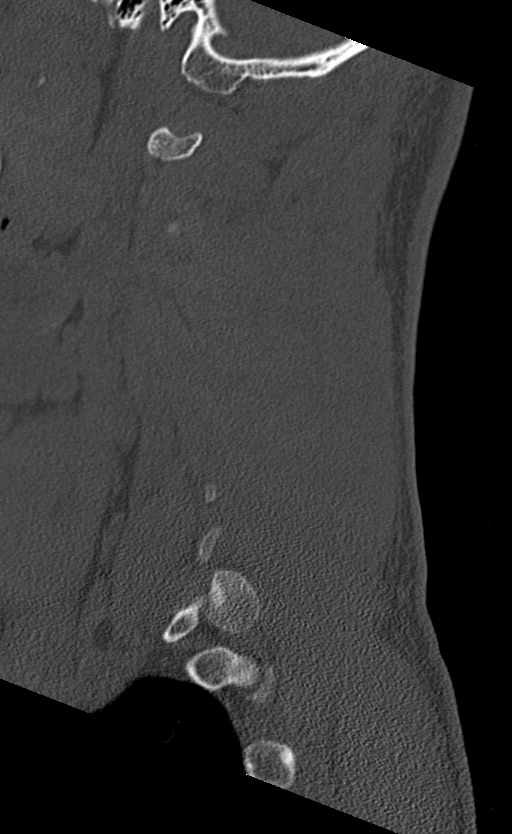
[im 29/70  bone]
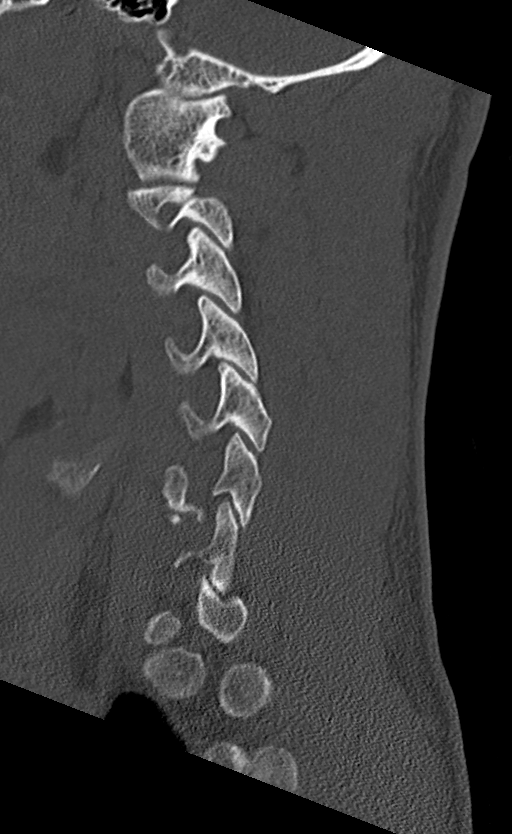
[im 35/70  soft-tissue]
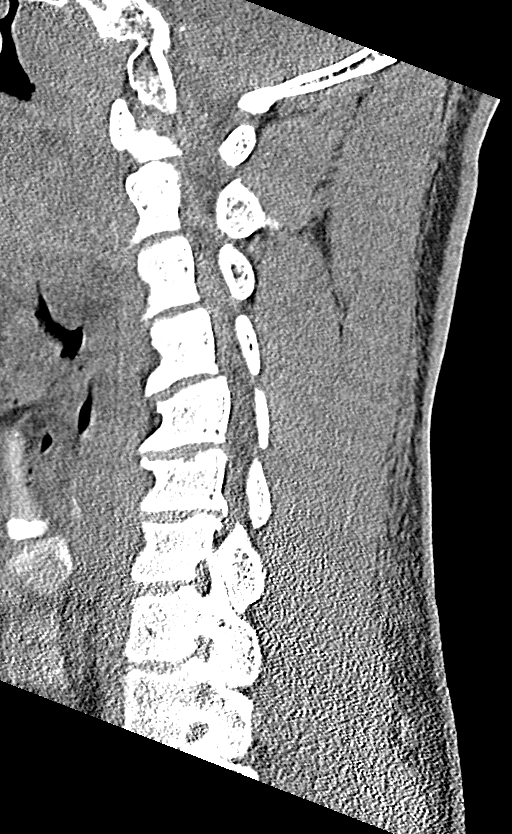
[im 35/70  bone]
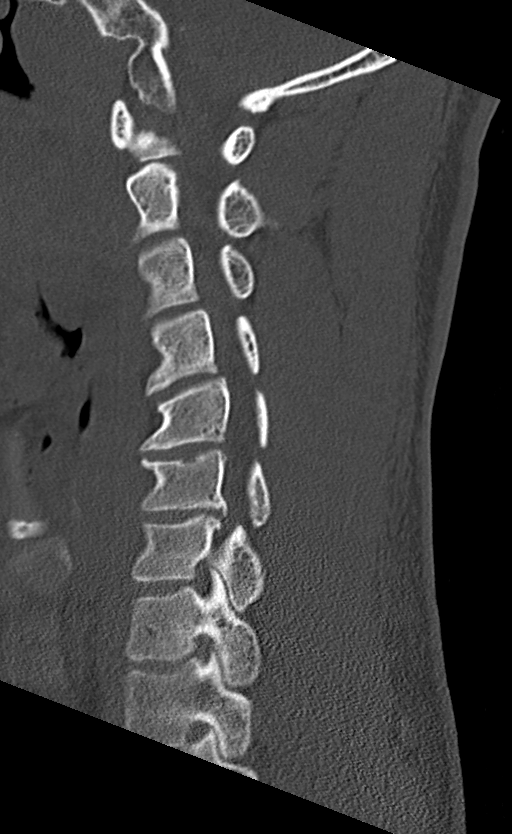
[im 41/70  bone]
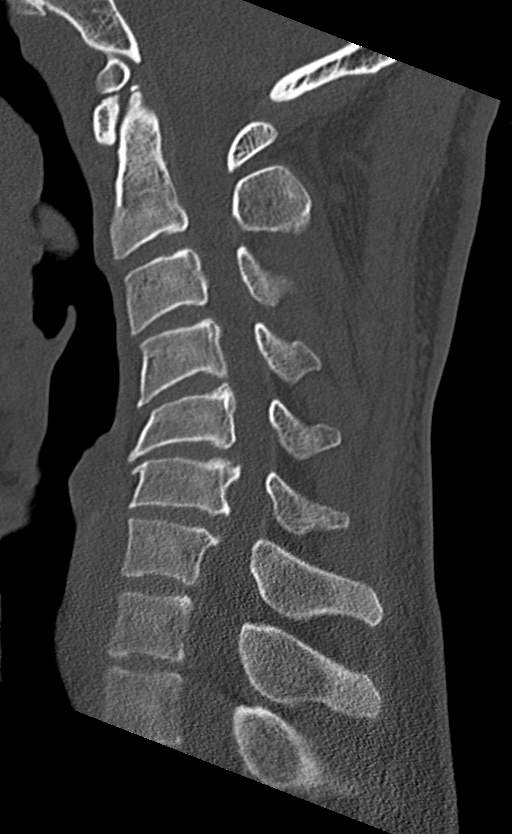
[im 47/70  bone]
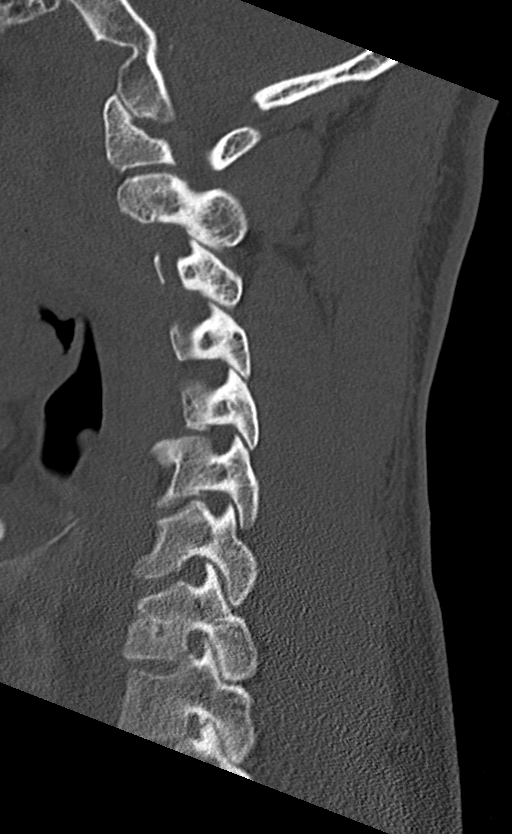

[Series 5: coronal bone · coronal · 0.27mm/px · 3 of 67 slices shown]
[im 14/67  bone]
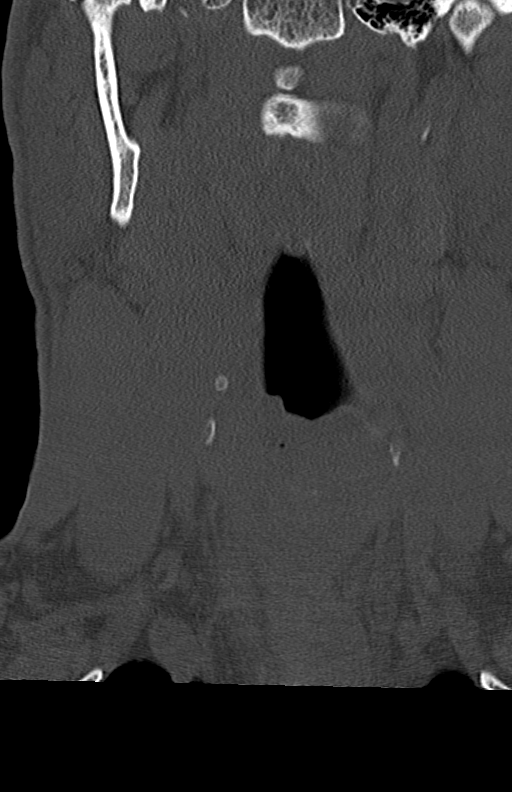
[im 27/67  bone]
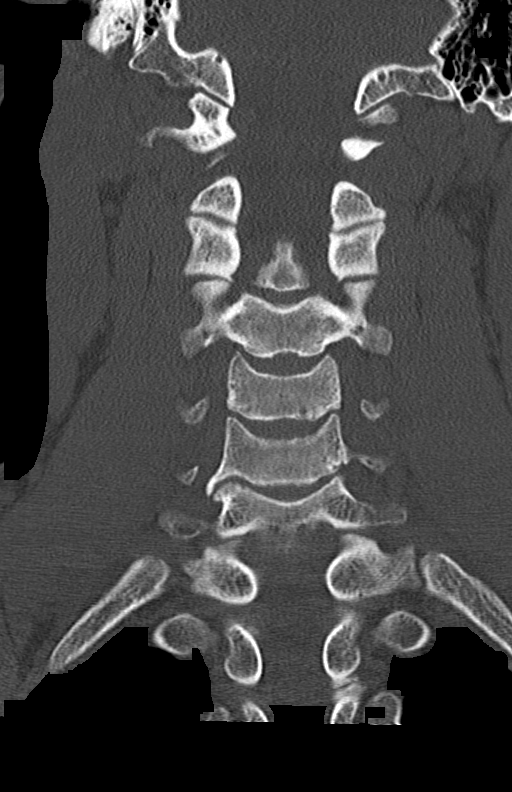
[im 40/67  bone]
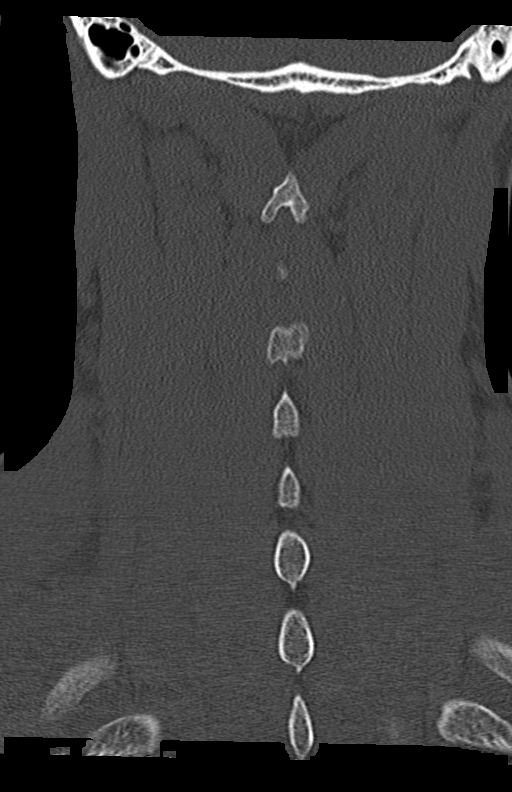

[Series 6: orthogonal bone · axial · 0.26mm/px · z∈[-247,-105]mm · 4 of 108 slices shown, 5 images]
[im 16/108  soft-tissue]
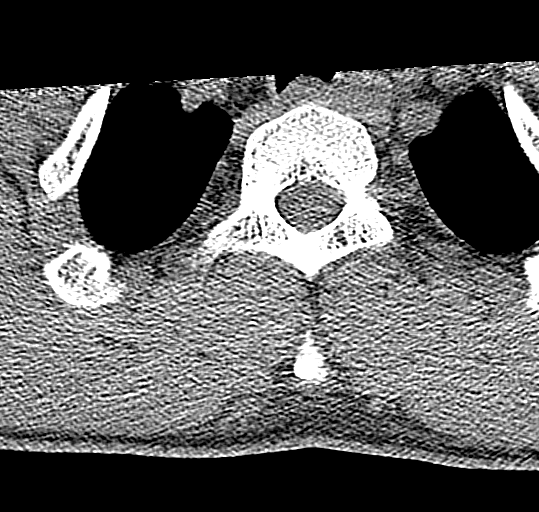
[im 16/108  bone]
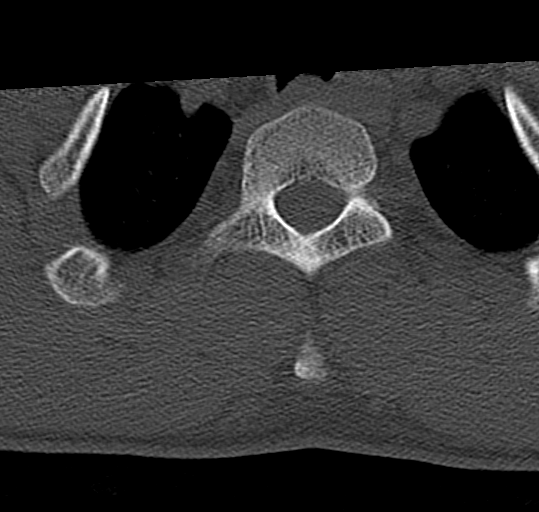
[im 46/108  bone]
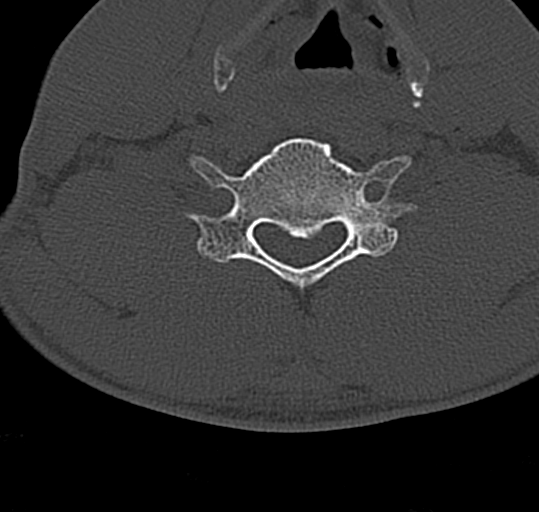
[im 62/108  bone]
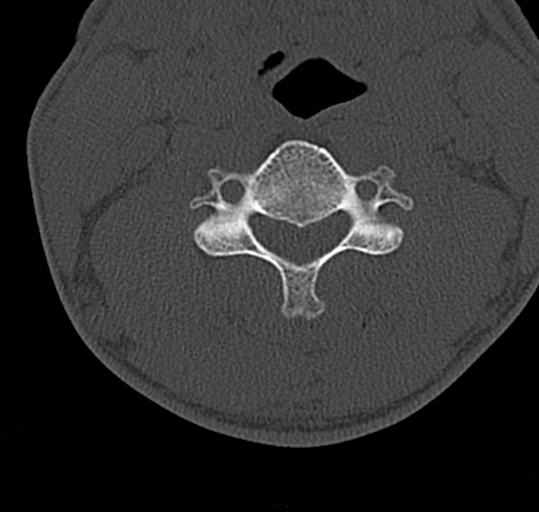
[im 92/108  bone]
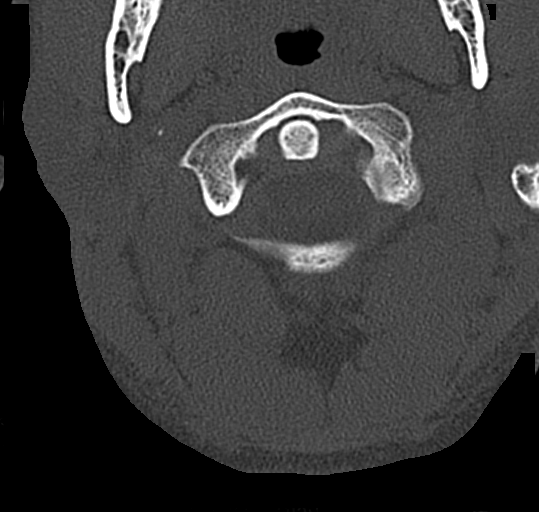

[12 of 33 positions shown; findings below may reference images not displayed]

FINDINGS: Alignment: There is reversal of cervical lordosis, likely due to
multilevel spondylosis in the mid to lower cervical spine. Otherwise
alignment is anatomic.

Skull base and vertebrae: No acute fracture. No primary bone lesion
or focal pathologic process.

Soft tissues and spinal canal: No prevertebral fluid or swelling. No
visible canal hematoma.

Disc levels: Prominent mid to lower cervical spondylosis, most
pronounced at the C4-5, C5-6, and C6-7 levels. There is significant
right predominant neural foraminal encroachment at C6-7 due to
uncovertebral hypertrophy and disc osteophyte complex.

Upper chest: Airway is patent.  Lung apices are clear.

Other: Reconstructed images demonstrate no additional findings.
IMPRESSION: 1. No acute cervical spine fracture.
2. Prominent lower cervical spondylosis, most pronounced at C6-7
with right predominant neural foraminal encroachment. Please
correlate with patient's right upper extremity radicular symptoms.

## 2023-05-12 ENCOUNTER — Ambulatory Visit: Payer: 59 | Admitting: Physician Assistant

## 2023-06-30 ENCOUNTER — Ambulatory Visit: Admitting: Physician Assistant

## 2023-11-20 ENCOUNTER — Emergency Department
Admission: EM | Admit: 2023-11-20 | Discharge: 2023-11-21 | Disposition: A | Discharge status: Home / Self Care | Attending: Emergency Medicine | Admitting: Emergency Medicine

## 2023-11-20 DIAGNOSIS — F1410 Cocaine abuse, uncomplicated: Secondary | ICD-10-CM

## 2023-11-20 DIAGNOSIS — F1414 Cocaine abuse with cocaine-induced mood disorder: Secondary | ICD-10-CM | POA: Insufficient documentation

## 2023-11-20 DIAGNOSIS — F4320 Adjustment disorder, unspecified: Secondary | ICD-10-CM | POA: Diagnosis present

## 2023-11-20 DIAGNOSIS — B356 Tinea cruris: Secondary | ICD-10-CM | POA: Insufficient documentation

## 2023-11-20 DIAGNOSIS — F1494 Cocaine use, unspecified with cocaine-induced mood disorder: Secondary | ICD-10-CM

## 2023-11-20 LAB — CBC WITH DIFFERENTIAL/PLATELET
Abs Immature Granulocytes: 0.05 K/uL (ref 0.00–0.07)
Basophils Absolute: 0.1 K/uL (ref 0.0–0.1)
Basophils Relative: 1 %
Eosinophils Absolute: 0.4 K/uL (ref 0.0–0.5)
Eosinophils Relative: 3 %
HCT: 44.2 % (ref 39.0–52.0)
Hemoglobin: 14.7 g/dL (ref 13.0–17.0)
Immature Granulocytes: 1 %
Lymphocytes Relative: 20 %
Lymphs Abs: 2.2 K/uL (ref 0.7–4.0)
MCH: 27 pg (ref 26.0–34.0)
MCHC: 33.3 g/dL (ref 30.0–36.0)
MCV: 81.3 fL (ref 80.0–100.0)
Monocytes Absolute: 1.4 K/uL — ABNORMAL HIGH (ref 0.1–1.0)
Monocytes Relative: 13 %
Neutro Abs: 7.1 K/uL (ref 1.7–7.7)
Neutrophils Relative %: 62 %
Platelets: 294 K/uL (ref 150–400)
RBC: 5.44 MIL/uL (ref 4.22–5.81)
RDW: 15.3 % (ref 11.5–15.5)
WBC: 11.1 K/uL — ABNORMAL HIGH (ref 4.0–10.5)
nRBC: 0 % (ref 0.0–0.2)

## 2023-11-20 LAB — URINE DRUG SCREEN, QUALITATIVE (ARMC ONLY)
Amphetamines, Ur Screen: NOT DETECTED
Barbiturates, Ur Screen: NOT DETECTED
Benzodiazepine, Ur Scrn: NOT DETECTED
Cannabinoid 50 Ng, Ur ~~LOC~~: POSITIVE — AB
Cocaine Metabolite,Ur ~~LOC~~: POSITIVE — AB
MDMA (Ecstasy)Ur Screen: NOT DETECTED
Methadone Scn, Ur: NOT DETECTED
Opiate, Ur Screen: NOT DETECTED
Phencyclidine (PCP) Ur S: NOT DETECTED
Tricyclic, Ur Screen: NOT DETECTED

## 2023-11-20 LAB — COMPREHENSIVE METABOLIC PANEL WITH GFR
ALT: 15 U/L (ref 0–44)
AST: 22 U/L (ref 15–41)
Albumin: 3.8 g/dL (ref 3.5–5.0)
Alkaline Phosphatase: 54 U/L (ref 38–126)
Anion gap: 9 (ref 5–15)
BUN: 17 mg/dL (ref 6–20)
CO2: 26 mmol/L (ref 22–32)
Calcium: 8.9 mg/dL (ref 8.9–10.3)
Chloride: 106 mmol/L (ref 98–111)
Creatinine, Ser: 1.17 mg/dL (ref 0.61–1.24)
GFR, Estimated: >60 mL/min (ref >60)
Glucose, Bld: 82 mg/dL (ref 70–99)
Potassium: 4 mmol/L (ref 3.5–5.1)
Sodium: 141 mmol/L (ref 135–145)
Total Bilirubin: 0.7 mg/dL (ref 0.0–1.2)
Total Protein: 6.1 g/dL — ABNORMAL LOW (ref 6.5–8.1)

## 2023-11-20 LAB — ACETAMINOPHEN LEVEL: Acetaminophen (Tylenol), Serum: <10 ug/mL — ABNORMAL LOW (ref 10–30)

## 2023-11-20 LAB — ETHANOL: Alcohol, Ethyl (B): <15 mg/dL (ref <15)

## 2023-11-20 LAB — SALICYLATE LEVEL: Salicylate Lvl: <7 mg/dL — ABNORMAL LOW (ref 7.0–30.0)

## 2023-11-20 MED ORDER — NYSTATIN 100000 UNIT/GM EX POWD
Freq: Two times a day (BID) | CUTANEOUS | Status: DC
  Filled 2023-11-20: qty 15

## 2023-11-20 NOTE — ED Notes (Addendum)
 Received pt to room 5bhu.  Pt cursing at nurse saying this room is hot, I want a tv remote and I want out of this mother fucker.  Pt moved to room 3 for cooler room.  Pt also requesting dinner meal.  Pt also informed of rules/bathroom/cameras on unit by this Clinical research associate.

## 2023-11-20 NOTE — ED Triage Notes (Addendum)
 Pt arrives with c/o SI for the past 3 days. Per pt, him and his wife have been having martial issues, life stressors, and he has been having a hard time coping. Pt denies HI. Pt does not have a plan at this time. Pt also reports having a rash a in his genital area. Pt is voluntary.

## 2023-11-20 NOTE — ED Notes (Signed)
 Pt taken to interview room with security in main ER.

## 2023-11-20 NOTE — ED Notes (Signed)
Dinner meal tray given to pt.

## 2023-11-20 NOTE — Discharge Instructions (Addendum)
  You have been seen in the Emergency Department (ED) today for a psychiatric complaint.  You have been evaluated by psychiatry and we believe you are safe to be discharged from the hospital.    Please return to the ED immediately if you have ANY thoughts of hurting yourself or anyone else, so that we may help you.  Please avoid alcohol and drug use.  Follow up with your doctor and/or therapist as soon as possible regarding today's ED visit.   Please follow up any other recommendations and clinic appointments provided by the psychiatry team that saw you in the Emergency Department.   Regarding the itching around her groin area, I suspect you have a mild jock itch.  Please start use of a antifungal powder over-the-counter such as 'miconazole' and follow instructions on the packaging

## 2023-11-20 NOTE — ED Notes (Signed)
Snack given to pt.

## 2023-11-20 NOTE — ED Notes (Signed)
 VOL/  PENDING  CONSULT

## 2023-11-20 NOTE — ED Notes (Signed)
 RN in room and introduced self to pt. Pt calm and cooperative. Pt states that him and his wife have been having issues. Pt states that he lives with his wife and 3 step children. Pt states that he feels like no matter what he does it is never enough. Pt reports that things have gotten to the point that he cannot take it anymore. Pt feels like if he does not get help that either he will hurt himself or his wife but he does not have a plan as to what he would do. Pt states that he can feel himself getting worked up and then he gets to the point to where he cannot take it anymore. Pt states that he wakes up every morning and is miserable.  Pt denies hx/o anxiety or depression. Pt states that he has not drink alcohol in approximately 1 year. Pt states that he does smoke marijuana and did do cocaine yesterday.
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
## 2023-11-20 NOTE — ED Notes (Signed)
 Patient transported via WC to BHU RM 5.
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
## 2023-11-20 NOTE — ED Provider Notes (Signed)
 [facility] Provider Note    Event Date/Time   First MD Initiated Contact with Patient 11/20/23 1548     (approximate)   History   Psychiatric Evaluation   HPI  Benjamin Combs is a 48 y.o. male   reports that for the last few days been under a lot of stress, things were not going well with his marriage.  Reports he used to drink alcohol and his mind was never clear but he stopped using alcohol for some time now.  He came today because he feels like he needs to get things straight, so he can get things better with his family.   He is also had a slight itching along the groin for the last week or so, no pain or discomfort with urination.  Denies STD or any urethral discharge.  Reports that seem to start nonlocked long after shaving the genital area      Physical Exam   Triage Vital Signs: ED Triage Vitals  Encounter Vitals Group     BP 11/20/23 1505 (!) 146/93     Girls Systolic BP Percentile --      Girls Diastolic BP Percentile --      Boys Systolic BP Percentile --      Boys Diastolic BP Percentile --      Pulse Rate 11/20/23 1505 76     Resp 11/20/23 1505 16     Temp 11/20/23 1505 98 F (36.7 C)     Temp Source 11/20/23 1505 Oral     SpO2 11/20/23 1505 98 %     Weight --      Height --      Head Circumference --      Peak Flow --      Pain Score 11/20/23 1503 0     Pain Loc --      Pain Education --      Exclude from Growth Chart --     Most recent vital signs: Vitals:   11/20/23 1815 11/20/23 1931  BP: 124/81 (!) 128/92  Pulse: 82 83  Resp: 17 18  Temp: 99 F (37.2 C) 98.1 F (36.7 C)  SpO2: 99% 99%     General: Awake, no distress.  Pleasant.  Denies being suicidal or homicidal.  Reports he thought a little bit about suicide a couple days ago, but those thoughts have gone away.  Does not want to hurt himself now but realizes he needs to get things straightened out CV:  Good peripheral perfusion.  Resp:  Normal effort.   Abd:  No distention.  Soft nontender Other:  Exam was secured in room.  Normal penis, circumcised and scrotum.  There is a very slight area of irritation and occasional areas of raised skin surrounding follicles and along the intertriginous groin folds.  Reports area to be itchy.   ED Results / Procedures / Treatments   Labs (all labs ordered are listed, but only abnormal results are displayed) Labs Reviewed  COMPREHENSIVE METABOLIC PANEL WITH GFR - Abnormal; Notable for the following components:      Result Value   Total Protein 6.1 (*)    All other components within normal limits  URINE DRUG SCREEN, QUALITATIVE ([facility] ONLY) - Abnormal; Notable for the following components:   Cocaine Metabolite,Ur [place] POSITIVE (*)    Cannabinoid 50 Ng, Ur [place] POSITIVE (*)    All other components within normal limits  CBC WITH DIFFERENTIAL/PLATELET - Abnormal; Notable for the following  components:   WBC 11.1 (*)    Monocytes Absolute 1.4 (*)    All other components within normal limits  SALICYLATE LEVEL - Abnormal; Notable for the following components:   Salicylate Lvl <7.0 (*)    All other components within normal limits  ACETAMINOPHEN  LEVEL - Abnormal; Notable for the following components:   Acetaminophen  (Tylenol ), Serum <10 (*)    All other components within normal limits  ETHANOL  RPR   Labs reassuring except notably positive for cocaine  EKG     RADIOLOGY     PROCEDURES:  Critical Care performed: No  Procedures   MEDICATIONS ORDERED IN ED: Medications  nystatin  (MYCOSTATIN /NYSTOP ) topical powder (has no administration in time range)     IMPRESSION / MDM / ASSESSMENT AND PLAN / ED COURSE  I reviewed the triage vital signs and the nursing notes.                              Differential diagnosis includes, but is not limited to, voluntary psychiatric valuation.  Currently denies suicidal or homicidal ideations.  He presents voluntarily and would like to talk to  psychiatrist about life stressors and many issues he is experiencing.  I also think concomitant use of cocaine is likely part of today's evaluation/symptoms.  He also has a rash consistent with mild urticaria suspect likely mild jock itch versus very mild folliculitis though favor jock itch.  Will treat with nystatin  here and recommend over-the-counter jock itch treatment  RPR added on though no findings that were clearly signifying of syphilis  Patient's presentation is most consistent with acute complicated illness / injury requiring diagnostic workup.   Medical screening labs reassuring no acute abnormalities.  Patient pending voluntary consult with psychiatry.  He is currently voluntary, denies active suicidal or injurious ideations.  If he does become suicidal however, would consider placing under IVC.  The patient has been placed in psychiatric observation due to the need to provide a safe environment for the patient while obtaining psychiatric consultation and evaluation, as well as ongoing medical and medication management to treat the patient's condition.  The patient has not been placed under full IVC at this time.        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   Dicky Anes, MD 11/20/23 2020

## 2023-11-20 NOTE — ED Notes (Signed)
 Pt brought back from interview room  pt calm and cooperative.

## 2023-11-20 NOTE — ED Notes (Signed)
 EDP, Quale at bedside.

## 2023-11-20 NOTE — ED Notes (Signed)
 PT BELONGINGS:  Orange Sweatshirt Purple T-shirt Hat Cigarettes Lighter Cell Phone 1 pair of shoes Camo Pants Blue underwear [employer]

## 2023-11-21 NOTE — ED Notes (Signed)
 Pt up to bathroom.

## 2023-11-21 NOTE — ED Provider Notes (Signed)
-----------------------------------------   8:59 AM on 11/21/2023 -----------------------------------------   I was informed by RN Wilbern that the patient wants to be discharged.  I reviewed the prior notes from the previous ED physician and psychiatry provider.  The patient presented voluntarily.  He was evaluated by NP McLauchlin yesterday evening and was recommended for overnight observation, however he was determined to be at low risk for self-harm and was not recommended for inpatient admission.  On reassessment this morning, the patient is alert.  He denies SI or HI.  He states that he feels fine.  He states that he just came in to talk to somebody, and was okay with staying in the ED overnight, but now feels that being here is adding to his stress because he cannot get anything done and he has business to attend to.  The patient is calm and cooperative, answering my questions appropriately, and does not demonstrate any acute psychiatric symptoms.  He is not under involuntary commitment.  He is able to contract for safety.  At this time, given the reassuring psychiatric evaluation from last night and my reassessment this morning, there is no evidence of acute danger to self or others.  I do not feel that the patient needs to wait for psychiatric reevaluation at this time.  I am discharging him per his wishes.  I gave strict return precautions, and he expressed understanding.   Jacolyn Pae, MD 11/21/23 [phone]

## 2023-11-21 NOTE — ED Notes (Signed)
 Requested water and provided

## 2023-11-21 NOTE — ED Notes (Signed)
 Patient sleeping visible rise and fall of chest monitoring ongoing

## 2023-11-21 NOTE — ED Notes (Signed)
 Up to bathroom with steady gait.

## 2023-11-21 NOTE — ED Notes (Signed)
Breakfast tray provided to pt.

## 2023-11-22 LAB — RPR: RPR Ser Ql: NONREACTIVE

## 2023-12-25 ENCOUNTER — Other Ambulatory Visit: Payer: Self-pay

## 2023-12-25 ENCOUNTER — Encounter: Payer: Self-pay | Admitting: *Deleted

## 2023-12-25 ENCOUNTER — Emergency Department
Admission: EM | Admit: 2023-12-25 | Discharge: 2023-12-25 | Disposition: A | Discharge status: Home / Self Care | Attending: Emergency Medicine | Admitting: Emergency Medicine

## 2023-12-25 DIAGNOSIS — F17210 Nicotine dependence, cigarettes, uncomplicated: Secondary | ICD-10-CM | POA: Diagnosis not present

## 2023-12-25 DIAGNOSIS — R059 Cough, unspecified: Secondary | ICD-10-CM | POA: Diagnosis present

## 2023-12-25 DIAGNOSIS — J069 Acute upper respiratory infection, unspecified: Secondary | ICD-10-CM | POA: Insufficient documentation

## 2023-12-25 LAB — RESP PANEL BY RT-PCR (RSV, FLU A&B, COVID)  RVPGX2
Influenza A by PCR: NEGATIVE
Influenza B by PCR: NEGATIVE
Resp Syncytial Virus by PCR: NEGATIVE
SARS Coronavirus 2 by RT PCR: NEGATIVE

## 2023-12-25 LAB — GROUP A STREP BY PCR: Group A Strep by PCR: NOT DETECTED

## 2023-12-25 MED ORDER — PREDNISONE 10 MG (21) PO TBPK: ORAL_TABLET | ORAL | 0 refills | Status: DC

## 2023-12-25 MED ORDER — IPRATROPIUM-ALBUTEROL 0.5-2.5 (3) MG/3ML IN SOLN
3.0000 mL | Freq: Once | RESPIRATORY_TRACT | Status: AC
  Administered 2023-12-25: 3 mL via RESPIRATORY_TRACT
  Filled 2023-12-25: qty 3

## 2023-12-25 MED ORDER — ALBUTEROL SULFATE HFA 108 (90 BASE) MCG/ACT IN AERS: 2.0000 | INHALATION_SPRAY | RESPIRATORY_TRACT | 1 refills | Status: DC | PRN

## 2023-12-25 MED ORDER — AZITHROMYCIN 250 MG PO TABS: ORAL_TABLET | ORAL | 0 refills | Status: DC

## 2023-12-25 MED ORDER — GUAIFENESIN-CODEINE 100-10 MG/5ML PO SOLN: 10.0000 mL | Freq: Three times a day (TID) | ORAL | 0 refills | Status: DC | PRN

## 2023-12-25 MED ORDER — PREDNISONE 20 MG PO TABS
60.0000 mg | ORAL_TABLET | Freq: Once | ORAL | Status: AC
  Administered 2023-12-25: 60 mg via ORAL
  Filled 2023-12-25: qty 3

## 2023-12-25 NOTE — Discharge Instructions (Signed)
 Please take medication as prescribed.  If you are not improving over the next week, please return to the emergency department or see primary care.

## 2023-12-25 NOTE — ED Provider Notes (Signed)
 [facility] Provider Note    Event Date/Time   First MD Initiated Contact with Patient 12/25/23 2142     (approximate)   History   flu like sx   HPI  Benjamin Combs is a 48 y.o. male with history of anxiety and as listed in EMR presents to the emergency department for treatment and evaluation of sinus congestion, sore throat, cough for the past 4 days.  No fever.  He does smoke cigarettes.  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  No relief with over-the-counter cold medications..     Physical Exam    Vitals:   12/25/23 2034  BP: 123/85  Pulse: 93  Resp: 18  Temp: 98.5 F (36.9 C)  SpO2: 98%    General: Awake, no distress.  CV:  Good peripheral perfusion.  Resp:  Normal effort.  Rhonchi noted throughout. Abd:  No distention. Soft. Other:     ED Results / Procedures / Treatments   Labs (all labs ordered are listed, but only abnormal results are displayed)  Labs Reviewed  RESP PANEL BY RT-PCR (RSV, FLU A&B, COVID)  [id number]  GROUP A STREP BY PCR     EKG  Not indicated.   RADIOLOGY  Image and radiology report reviewed and interpreted by me. Radiology report consistent with the same.  Not indicated.  PROCEDURES:  Critical Care performed: No  Procedures   MEDICATIONS ORDERED IN ED:  Medications  ipratropium-albuterol  (DUONEB) 0.5-2.5 (3) MG/3ML nebulizer solution 3 mL (3 mLs Nebulization Given 12/25/23 2211)  predniSONE  (DELTASONE ) tablet 60 mg (60 mg Oral Given 12/25/23 2211)     IMPRESSION / MDM / ASSESSMENT AND PLAN / ED COURSE   I have reviewed the triage note and vital signs. Vital signs are stable   Differential diagnosis includes, but is not limited to, URI, COVID, influenza, pneumonia  Patient's presentation is most consistent with acute illness / injury with system symptoms.  48 year old male presenting to the emergency department for treatment and evaluation of nonproductive cough  and sore throat for the past 4 days.  See HPI for further details.  He is afebrile with a normal respiratory rate and O2 sat of 98% on room air.  On exam, he has rhonchi throughout.  Plan will be to give him prednisone  and DuoNeb while here.  He will be discharged home with prescription for albuterol , prednisone , and because he is a  cigarette smoker and has rhonchi throughout we will also treat with antibiotics.        FINAL CLINICAL IMPRESSION(S) / ED DIAGNOSES   Final diagnoses:  Acute URI     Rx / DC Orders   ED Discharge Orders          Ordered    albuterol  (VENTOLIN  HFA) 108 (90 Base) MCG/ACT inhaler  Every 4 hours PRN        12/25/23 2251    predniSONE  (STERAPRED UNI-PAK 21 TAB) 10 MG (21) TBPK tablet        12/25/23 2251    guaiFENesin-codeine 100-10 MG/5ML syrup  3 times daily PRN        12/25/23 2251    azithromycin (ZITHROMAX) 250 MG tablet        12/25/23 2251             Note:  This document was prepared using Dragon voice recognition software and may include unintentional dictation errors.   Herlinda Kirk NOVAK, FNP 12/25/23 2252  Claudene Rover, MD 12/25/23 [phone]

## 2023-12-25 NOTE — ED Triage Notes (Signed)
 Pt ambulatory to triage.  Pt has  sinus congestion, sore throat, cough for 4 days.  Pt alert  speech clear.

## 2023-12-25 NOTE — ED Notes (Signed)
 Pt given Dc instructions. Pt verbalized understanding of medications and follow up care. Pt ambulatory from ED without difficulty.

## 2024-03-14 ENCOUNTER — Emergency Department
Admission: EM | Admit: 2024-03-14 | Discharge: 2024-03-14 | Disposition: A | Discharge status: Home / Self Care | Attending: Emergency Medicine | Admitting: Emergency Medicine

## 2024-03-14 ENCOUNTER — Encounter: Payer: Self-pay | Admitting: Emergency Medicine

## 2024-03-14 ENCOUNTER — Other Ambulatory Visit: Payer: Self-pay

## 2024-03-14 DIAGNOSIS — B369 Superficial mycosis, unspecified: Secondary | ICD-10-CM | POA: Diagnosis not present

## 2024-03-14 DIAGNOSIS — L299 Pruritus, unspecified: Secondary | ICD-10-CM | POA: Diagnosis present

## 2024-03-14 NOTE — ED Triage Notes (Signed)
 Pt to ED via POV. Pt states that he was bitten by an insect on the left side of his neck 2 days ago. Pt states that the area is itching. Pt has not used OTC medication for the itching. Pt is in NAD.

## 2024-03-14 NOTE — Discharge Instructions (Addendum)
 You are being treated for a yeast infection of the skin.  You can use any OTC yeast infection cream for treatment.  Keep the area clean, dry, and moisturize in between.  Know that this type of skin infection can be contagious, can be spread to others, and can be spread to other parts of your body.  You may also consider using OTC dandruff shampoo like cell some blue or Nizoral as a body wash, weekly.

## 2024-03-14 NOTE — ED Provider Notes (Signed)
 "   [facility] Emergency Department Provider Note     Event Date/Time   First MD Initiated Contact with Patient 03/14/24 1010     (approximate)   History   Insect Bite   HPI  Benjamin Combs is a 49 y.o. male with a history of anxiety, presents to the ED for evaluation of a suspected insect bite.  Patient report he noticed some spontaneous itching 2 days prior to the left side of his neck.  He presents to the ED for evaluation with no reports of injury to the same area.  Would endorse that the concerns him because he had previously been treated for jock itch infection, he is wonders if he may have spread the infection to his neck.  He denies any fevers, chills, sweats.  No purulent drainage, swelling, or erythema is reported.  Patient used some OTC cortisone cream with limited symptom relief.  He presents in no acute distress for evaluation of his symptoms.   Physical Exam   Triage Vital Signs: ED Triage Vitals [03/14/24 1000]  Encounter Vitals Group     BP (!) 112/96     Girls Systolic BP Percentile      Girls Diastolic BP Percentile      Boys Systolic BP Percentile      Boys Diastolic BP Percentile      Pulse Rate 90     Resp 16     Temp 98.6 F (37 C)     Temp Source Oral     SpO2 98 %     Weight 190 lb (86.2 kg)     Height 6' 1 (1.854 m)     Head Circumference      Peak Flow      Pain Score 0     Pain Loc      Pain Education      Exclude from Growth Chart     Most recent vital signs: Vitals:   03/14/24 1000  BP: (!) 112/96  Pulse: 90  Resp: 16  Temp: 98.6 F (37 C)  SpO2: 98%    General Awake, no distress.  NAD HEENT NCAT. PERRL. EOMI. No rhinorrhea. Mucous membranes are moist.  CV:  Good peripheral perfusion.  RRR RESP:  Normal effort.  SKIN:  Patient with a singular, nummular macular lesion to the left lateral neck.  Some overlying peripheral scaling is noted.  No induration, erythema, warmth, fluctuance, or  pointing.   ED Results / Procedures / Treatments   Labs (all labs ordered are listed, but only abnormal results are displayed) Labs Reviewed - No data to display   EKG    RADIOLOGY   No results found.   PROCEDURES:  Critical Care performed: No  Procedures   MEDICATIONS ORDERED IN ED: Medications - No data to display   IMPRESSION / MDM / ASSESSMENT AND PLAN / ED COURSE  I reviewed the triage vital signs and the nursing notes.                              Differential diagnosis includes, but is not limited to, insect bite, allergic reaction, abscess, cellulitis, contact dermatitis, yeast dermatitis  Patient's presentation is most consistent with acute, uncomplicated illness.  Patient's diagnosis is consistent with yeast dermatitis.  Patient presents with a single nummular, scaly lesion to the lateral neck consistent with dermatophyte infection.  Patient will be discharged home with instruction to  use an OTC antifungal cream topically. Patient is to follow up with his PCP or local urgent care as suggested, as needed or otherwise directed. Patient is given ED precautions to return to the ED for any worsening or new symptoms.     FINAL CLINICAL IMPRESSION(S) / ED DIAGNOSES   Final diagnoses:  Fungal skin infection     Rx / DC Orders   ED Discharge Orders     None        Note:  This document was prepared using Dragon voice recognition software and may include unintentional dictation errors.    Loyd Candida LULLA Aldona, PA-C 03/14/24 1104    Ernest Ronal BRAVO, MD 03/15/24 1528  "
